# Patient Record
Sex: Male | Born: 1995 | Race: White | Hispanic: No | Marital: Single | Smoking: Current some day smoker
Health system: Southern US, Community
[De-identification: ages and names within clinical notes are randomized; demographics above are authoritative.]

## PROBLEM LIST (undated history)

## (undated) ENCOUNTER — Ambulatory Visit (HOSPITAL_COMMUNITY): Admission: EM | Payer: Medicaid Other | Source: Home / Self Care

## (undated) ENCOUNTER — Ambulatory Visit (HOSPITAL_COMMUNITY): Disposition: A | Discharge status: Home / Self Care | Payer: Self-pay

## (undated) DIAGNOSIS — Z653 Problems related to other legal circumstances: Secondary | ICD-10-CM

## (undated) DIAGNOSIS — R112 Nausea with vomiting, unspecified: Secondary | ICD-10-CM

## (undated) DIAGNOSIS — Z87898 Personal history of other specified conditions: Secondary | ICD-10-CM

## (undated) DIAGNOSIS — Z21 Asymptomatic human immunodeficiency virus [HIV] infection status: Secondary | ICD-10-CM

## (undated) DIAGNOSIS — S060X9A Concussion with loss of consciousness of unspecified duration, initial encounter: Secondary | ICD-10-CM

## (undated) DIAGNOSIS — B20 Human immunodeficiency virus [HIV] disease: Secondary | ICD-10-CM

## (undated) DIAGNOSIS — F17200 Nicotine dependence, unspecified, uncomplicated: Secondary | ICD-10-CM

## (undated) HISTORY — PX: HAND SURGERY: SHX662

## (undated) HISTORY — DX: Nausea with vomiting, unspecified: R11.2

## (undated) HISTORY — DX: Personal history of other specified conditions: Z87.898

## (undated) HISTORY — DX: Problems related to other legal circumstances: Z65.3

## (undated) HISTORY — DX: Concussion with loss of consciousness of unspecified duration, initial encounter: S06.0X9A

## (undated) HISTORY — DX: Nicotine dependence, unspecified, uncomplicated: F17.200

---
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
## 2014-02-16 ENCOUNTER — Emergency Department (INDEPENDENT_AMBULATORY_CARE_PROVIDER_SITE_OTHER)
Admission: EM | Admit: 2014-02-16 | Discharge: 2014-02-16 | Disposition: A | Discharge status: Home / Self Care | Payer: Medicaid Other | Source: Home / Self Care

## 2014-02-16 ENCOUNTER — Encounter (HOSPITAL_COMMUNITY): Payer: Self-pay | Admitting: Emergency Medicine

## 2014-02-16 DIAGNOSIS — S6990XA Unspecified injury of unspecified wrist, hand and finger(s), initial encounter: Secondary | ICD-10-CM

## 2014-02-16 DIAGNOSIS — M79644 Pain in right finger(s): Secondary | ICD-10-CM

## 2014-02-16 DIAGNOSIS — X58XXXA Exposure to other specified factors, initial encounter: Secondary | ICD-10-CM

## 2014-02-16 DIAGNOSIS — M79609 Pain in unspecified limb: Secondary | ICD-10-CM

## 2014-02-16 DIAGNOSIS — S6991XA Unspecified injury of right wrist, hand and finger(s), initial encounter: Secondary | ICD-10-CM

## 2014-02-16 DIAGNOSIS — S6980XA Other specified injuries of unspecified wrist, hand and finger(s), initial encounter: Secondary | ICD-10-CM

## 2014-02-16 MED ORDER — CEPHALEXIN 500 MG PO CAPS: 500.0000 mg | ORAL_CAPSULE | Freq: Four times a day (QID) | ORAL | Status: DC

## 2014-02-16 NOTE — ED Notes (Signed)
Was peeling metal holder, off a picture 3 days ago, it went under his nail and started bleeding.  Then he went fishing the same day with raw liver.  He noted swelling onset 2 days ago.  Has decreased ROM now due to pain.

## 2014-02-16 NOTE — ED Provider Notes (Signed)
CSN: [id number]     Arrival date & time 02/16/14  1843 History   First MD Initiated Contact with Patient 02/16/14 1942     Chief Complaint  Patient presents with  . Wound Infection   (Consider location/radiation/quality/duration/timing/severity/associated sxs/prior Treatment) HPI Comments: As above. Pain primarily to the thumb nail and pulp, lesser to the phalynx and IP joint.   History reviewed. No pertinent past medical history. Past Surgical History  Procedure Laterality Date  . Hand surgery     History reviewed. No pertinent family history. History  Substance Use Topics  . Smoking status: Current Some Day Smoker -- 0.50 packs/day    Types: Cigarettes  . Smokeless tobacco: Not on file  . Alcohol Use: No    Review of Systems  Constitutional: Negative.   Respiratory: Negative.   Genitourinary: Negative.   Musculoskeletal:       As per HPI  Skin: Negative for color change, pallor and rash.  Neurological: Negative for dizziness, weakness, numbness and headaches.    Allergies  Review of patient's allergies indicates no known allergies.  Home Medications   Prior to Admission medications   Medication Sig Start Date End Date Taking? Authorizing Provider  ibuprofen (ADVIL,MOTRIN) 600 MG tablet Take 1 tablet (600 mg total) by mouth every 6 (six) hours as needed. 11/24/13  Yes Loren Racer, MD  cephALEXin (KEFLEX) 500 MG capsule Take 1 capsule (500 mg total) by mouth 4 (four) times daily. 02/16/14   Hayden Rasmussen, NP  ibuprofen (ADVIL,MOTRIN) 200 MG tablet Take 600 mg by mouth every 6 (six) hours as needed (pain).    Historical Provider, MD  traMADol (ULTRAM) 50 MG tablet Take 1 tablet (50 mg total) by mouth every 6 (six) hours as needed. 11/24/13   Loren Racer, MD   BP 134/83  Pulse 60  Temp(Src) 99.3 F (37.4 C) (Oral)  Resp 14  SpO2 100% Physical Exam  Nursing note and vitals reviewed. Constitutional: He is oriented to person, place, and time. He appears  well-developed and well-nourished.  HENT:  Head: Normocephalic and atraumatic.  Eyes: EOM are normal.  Neck: Normal range of motion. Neck supple.  Musculoskeletal:  No redness. Minimal swelling to the distal thumb. No subungual hematoma or erythema. Nail plate intact, not loose.Tenderness to the pulp and over the nail but no signs of felon or other infection. Flexion and ext of the IP and MCP are intact to 80 deg of the IP. Mild tenderness to the IP joint.   Neurological: He is alert and oriented to person, place, and time. No cranial nerve deficit.  Skin: Skin is warm and dry.  Psychiatric: He has a normal mood and affect.    ED Course  Procedures (including critical care time) Labs Review Labs Reviewed - No data to display  Imaging Review No results found.   MDM   1. Pain of right thumb   2. Injury of nail bed of right thumb    I'm not certain this is truly infection but the mechanism of injury provides opportunity. No current signs of tenosynovitis at this time.  Keflex and warm soaks. F/U with hand doctor if worse.    Hayden Rasmussen, NP 02/16/14 2000

## 2014-02-16 NOTE — ED Provider Notes (Signed)
Medical screening examination/treatment/procedure(s) were performed by non-physician practitioner and as supervising physician I was immediately available for consultation/collaboration.  Timothy Trudell, M.D.  Arloa Prak C Laden Fieldhouse, MD 02/16/14 2225 

## 2014-03-17 ENCOUNTER — Emergency Department (INDEPENDENT_AMBULATORY_CARE_PROVIDER_SITE_OTHER)
Admission: EM | Admit: 2014-03-17 | Discharge: 2014-03-17 | Disposition: A | Discharge status: Home / Self Care | Payer: Medicaid Other | Source: Home / Self Care | Attending: Family Medicine | Admitting: Family Medicine

## 2014-03-17 ENCOUNTER — Encounter (HOSPITAL_COMMUNITY): Payer: Self-pay | Admitting: Emergency Medicine

## 2014-03-17 ENCOUNTER — Emergency Department (INDEPENDENT_AMBULATORY_CARE_PROVIDER_SITE_OTHER): Payer: Medicaid Other

## 2014-03-17 DIAGNOSIS — IMO0002 Reserved for concepts with insufficient information to code with codable children: Secondary | ICD-10-CM

## 2014-03-17 DIAGNOSIS — S60221A Contusion of right hand, initial encounter: Secondary | ICD-10-CM

## 2014-03-17 DIAGNOSIS — S60229A Contusion of unspecified hand, initial encounter: Secondary | ICD-10-CM

## 2014-03-17 NOTE — Discharge Instructions (Signed)
Ice and advil for soreness as needed,

## 2014-03-17 NOTE — ED Notes (Signed)
C/o right hand injury See physician note

## 2014-03-17 NOTE — ED Provider Notes (Signed)
CSN: [id number]     Arrival date & time 03/17/14  1856 History   First MD Initiated Contact with Patient 03/17/14 1935     Chief Complaint  Patient presents with  . Hand Injury   (Consider location/radiation/quality/duration/timing/severity/associated sxs/prior Treatment) Patient is a 18 y.o. male presenting with hand injury. The history is provided by the patient.  Hand Injury Location:  Hand Time since incident:  1 day Injury: yes   Mechanism of injury comment:  Punched a metal pole last eve from anger. Hand location:  Dorsum of R hand Pain details:    Quality:  Sharp   Radiates to:  Does not radiate   Severity:  Moderate   Onset quality:  Sudden Chronicity:  New Dislocation: no   Relieved by:  None tried Worsened by:  Nothing tried Associated symptoms: swelling   Associated symptoms: no numbness     History reviewed. No pertinent past medical history. Past Surgical History  Procedure Laterality Date  . Hand surgery     History reviewed. No pertinent family history. History  Substance Use Topics  . Smoking status: Current Some Day Smoker -- 0.50 packs/day    Types: Cigarettes  . Smokeless tobacco: Not on file  . Alcohol Use: No    Review of Systems  Constitutional: Negative.   Musculoskeletal: Positive for joint swelling.  Skin: Negative.     Allergies  Review of patient's allergies indicates no known allergies.  Home Medications   Prior to Admission medications   Medication Sig Start Date End Date Taking? Authorizing Provider  cephALEXin (KEFLEX) 500 MG capsule Take 1 capsule (500 mg total) by mouth 4 (four) times daily. 02/16/14   Hayden Rasmussenavid Mabe, NP  ibuprofen (ADVIL,MOTRIN) 200 MG tablet Take 600 mg by mouth every 6 (six) hours as needed (pain).    Historical Provider, MD  ibuprofen (ADVIL,MOTRIN) 600 MG tablet Take 1 tablet (600 mg total) by mouth every 6 (six) hours as needed. 11/24/13   Loren Raceravid Yelverton, MD  traMADol (ULTRAM) 50 MG tablet Take 1 tablet (50 mg  total) by mouth every 6 (six) hours as needed. 11/24/13   Loren Raceravid Yelverton, MD   BP 118/72  Pulse 75  Temp(Src) 98 F (36.7 C) (Oral)  Resp 12  SpO2 98% Physical Exam  Nursing note and vitals reviewed. Constitutional: He is oriented to person, place, and time. He appears well-developed and well-nourished. No distress.  Musculoskeletal: He exhibits tenderness.       Hands: Neurological: He is alert and oriented to person, place, and time.  Skin: Skin is warm and dry.    ED Course  Procedures (including critical care time) Labs Review Labs Reviewed - No data to display  Imaging Review Dg Hand Complete Right  03/17/2014   CLINICAL DATA:  Pain in the right hand.  EXAM: RIGHT HAND - COMPLETE 3+ VIEW  COMPARISON:  12/06/2011  FINDINGS: Three views of the right hand were obtained. Negative for a fracture or dislocation. Alignment of the right hand is normal. There is irregularity of the thumb tuft but this is likely chronic and could represent an old injury.  IMPRESSION: No acute bone abnormality.   Electronically Signed   By: Richarda OverlieAdam  Henn M.D.   On: 03/17/2014 20:22     MDM   1. Contusion of right hand, initial encounter    X-rays reviewed and report per radiologist.     Linna HoffJames D Malay Fantroy, MD 03/17/14 2035

## 2014-07-12 ENCOUNTER — Emergency Department (HOSPITAL_COMMUNITY)
Admission: EM | Admit: 2014-07-12 | Discharge: 2014-07-13 | Disposition: A | Discharge status: Home / Self Care | Payer: Medicaid Other | Attending: Emergency Medicine | Admitting: Emergency Medicine

## 2014-07-12 ENCOUNTER — Encounter (HOSPITAL_COMMUNITY): Payer: Self-pay | Admitting: Emergency Medicine

## 2014-07-12 ENCOUNTER — Emergency Department (HOSPITAL_COMMUNITY): Payer: Medicaid Other

## 2014-07-12 ENCOUNTER — Emergency Department (INDEPENDENT_AMBULATORY_CARE_PROVIDER_SITE_OTHER)
Admission: EM | Admit: 2014-07-12 | Discharge: 2014-07-12 | Disposition: A | Discharge status: Nursing Home (Includes ICF) | Payer: Medicaid Other | Source: Home / Self Care | Attending: Family Medicine | Admitting: Family Medicine

## 2014-07-12 DIAGNOSIS — R1084 Generalized abdominal pain: Secondary | ICD-10-CM | POA: Diagnosis not present

## 2014-07-12 DIAGNOSIS — Z792 Long term (current) use of antibiotics: Secondary | ICD-10-CM | POA: Diagnosis not present

## 2014-07-12 DIAGNOSIS — K529 Noninfective gastroenteritis and colitis, unspecified: Secondary | ICD-10-CM

## 2014-07-12 DIAGNOSIS — R112 Nausea with vomiting, unspecified: Secondary | ICD-10-CM | POA: Insufficient documentation

## 2014-07-12 DIAGNOSIS — Z720 Tobacco use: Secondary | ICD-10-CM | POA: Insufficient documentation

## 2014-07-12 DIAGNOSIS — R197 Diarrhea, unspecified: Secondary | ICD-10-CM

## 2014-07-12 DIAGNOSIS — R109 Unspecified abdominal pain: Secondary | ICD-10-CM

## 2014-07-12 DIAGNOSIS — E860 Dehydration: Secondary | ICD-10-CM

## 2014-07-12 LAB — CBC WITH DIFFERENTIAL/PLATELET
Basophils Absolute: 0 10*3/uL (ref 0.0–0.1)
Basophils Relative: 0 % (ref 0–1)
EOS ABS: 0 10*3/uL (ref 0.0–0.7)
Eosinophils Relative: 0 % (ref 0–5)
HCT: 41.8 % (ref 39.0–52.0)
HEMOGLOBIN: 14 g/dL (ref 13.0–17.0)
LYMPHS ABS: 2.2 10*3/uL (ref 0.7–4.0)
Lymphocytes Relative: 38 % (ref 12–46)
MCH: 28.2 pg (ref 26.0–34.0)
MCHC: 33.5 g/dL (ref 30.0–36.0)
MCV: 84.1 fL (ref 78.0–100.0)
MONOS PCT: 10 % (ref 3–12)
Monocytes Absolute: 0.6 10*3/uL (ref 0.1–1.0)
NEUTROS ABS: 2.9 10*3/uL (ref 1.7–7.7)
Neutrophils Relative %: 52 % (ref 43–77)
Platelets: 188 10*3/uL (ref 150–400)
RBC: 4.97 MIL/uL (ref 4.22–5.81)
RDW: 12.7 % (ref 11.5–15.5)
WBC: 5.7 10*3/uL (ref 4.0–10.5)

## 2014-07-12 LAB — COMPREHENSIVE METABOLIC PANEL
ALT: 15 U/L (ref 0–53)
AST: 27 U/L (ref 0–37)
Albumin: 4.1 g/dL (ref 3.5–5.2)
Alkaline Phosphatase: 101 U/L (ref 39–117)
Anion gap: 13 (ref 5–15)
BILIRUBIN TOTAL: 0.5 mg/dL (ref 0.3–1.2)
BUN: 11 mg/dL (ref 6–23)
CHLORIDE: 99 meq/L (ref 96–112)
CO2: 28 mEq/L (ref 19–32)
Calcium: 9.3 mg/dL (ref 8.4–10.5)
Creatinine, Ser: 0.77 mg/dL (ref 0.50–1.35)
GFR calc non Af Amer: >90 mL/min (ref >90)
GLUCOSE: 95 mg/dL (ref 70–99)
POTASSIUM: 4.4 meq/L (ref 3.7–5.3)
Sodium: 140 mEq/L (ref 137–147)
Total Protein: 8.1 g/dL (ref 6.0–8.3)

## 2014-07-12 MED ORDER — DICYCLOMINE HCL 10 MG PO CAPS
10.0000 mg | ORAL_CAPSULE | Freq: Once | ORAL | Status: AC
  Administered 2014-07-13: 10 mg via ORAL
  Filled 2014-07-12: qty 1

## 2014-07-12 MED ORDER — ONDANSETRON 4 MG PO TBDP
ORAL_TABLET | ORAL | Status: DC
Start: 2014-07-12 — End: 2014-07-13
  Filled 2014-07-12: qty 2

## 2014-07-12 MED ORDER — ONDANSETRON 4 MG PO TBDP
8.0000 mg | ORAL_TABLET | Freq: Once | ORAL | Status: AC
  Administered 2014-07-12: 8 mg via ORAL

## 2014-07-12 MED ORDER — ONDANSETRON HCL 4 MG/2ML IJ SOLN
4.0000 mg | Freq: Once | INTRAMUSCULAR | Status: AC
Start: 2014-07-12 — End: 2014-07-12
  Administered 2014-07-12: 4 mg via INTRAVENOUS
  Filled 2014-07-12: qty 2

## 2014-07-12 MED ORDER — SODIUM CHLORIDE 0.9 % IV BOLUS (SEPSIS)
1000.0000 mL | Freq: Once | INTRAVENOUS | Status: AC
  Administered 2014-07-12: 1000 mL via INTRAVENOUS

## 2014-07-12 NOTE — ED Notes (Signed)
Called for triage with no answer

## 2014-07-12 NOTE — ED Provider Notes (Signed)
CSN: [id number]     Arrival date & time 07/12/14  1813 History   First MD Initiated Contact with Patient 07/12/14 1836     Chief Complaint  Patient presents with  . Emesis   (Consider location/radiation/quality/duration/timing/severity/associated sxs/prior Treatment) Patient is a 18 y.o. male presenting with vomiting. The history is provided by the patient and a friend.  Emesis Severity:  Moderate Duration:  6 days Quality:  Bright red blood and stomach contents Progression:  Partially resolved Chronicity:  New Relieved by:  Nothing Worsened by:  Nothing tried Ineffective treatments:  None tried Associated symptoms: abdominal pain and diarrhea   Associated symptoms: no chills, no fever and no sore throat   Risk factors: sick contacts   Risk factors: no alcohol use     No past medical history on file. Past Surgical History  Procedure Laterality Date  . Hand surgery     No family history on file. History  Substance Use Topics  . Smoking status: Current Some Day Smoker -- 0.50 packs/day    Types: Cigarettes  . Smokeless tobacco: Not on file  . Alcohol Use: No    Review of Systems  Constitutional: Negative for fever, chills and appetite change.  HENT: Negative for sore throat.   Respiratory: Negative for cough.   Gastrointestinal: Positive for nausea, vomiting, abdominal pain and diarrhea. Negative for constipation and blood in stool.  Genitourinary: Negative.   Skin: Negative.     Allergies  Review of patient's allergies indicates no known allergies.  Home Medications   Prior to Admission medications   Medication Sig Start Date End Date Taking? Authorizing Provider  cephALEXin (KEFLEX) 500 MG capsule Take 1 capsule (500 mg total) by mouth 4 (four) times daily. 02/16/14   Hayden Rasmussenavid Mabe, NP  ibuprofen (ADVIL,MOTRIN) 200 MG tablet Take 600 mg by mouth every 6 (six) hours as needed (pain).    Historical Provider, MD  ibuprofen (ADVIL,MOTRIN) 600 MG tablet Take 1 tablet  (600 mg total) by mouth every 6 (six) hours as needed. 11/24/13   Loren Raceravid Yelverton, MD  traMADol (ULTRAM) 50 MG tablet Take 1 tablet (50 mg total) by mouth every 6 (six) hours as needed. 11/24/13   Loren Raceravid Yelverton, MD   BP 136/83 mmHg  Pulse 84  Temp(Src) 98.1 F (36.7 C) (Oral)  Resp 14  SpO2 98% Physical Exam  Constitutional: He is oriented to person, place, and time. He appears well-developed. He appears distressed.  HENT:  Mouth/Throat: Uvula is midline. Mucous membranes are dry. No oropharyngeal exudate or posterior oropharyngeal erythema.  Cardiovascular: Normal heart sounds.   Pulmonary/Chest: Breath sounds normal.  Abdominal: He exhibits no distension and no mass. Bowel sounds are decreased. There is no hepatosplenomegaly. There is tenderness in the right upper quadrant and right lower quadrant. There is no rigidity, no rebound, no guarding, no CVA tenderness, no tenderness at McBurney's point and negative Murphy's sign.  Neurological: He is alert and oriented to person, place, and time.  Skin: Skin is warm and dry.  Nursing note and vitals reviewed.   ED Course  Procedures (including critical care time) Labs Review Labs Reviewed - No data to display  Imaging Review No results found.   MDM   1. Gastroenteritis, acute   2. Mild dehydration        Linna HoffJames D Kamuela Magos, MD 07/12/14 1842

## 2014-07-12 NOTE — ED Notes (Signed)
Patient transported to X-ray 

## 2014-07-12 NOTE — ED Notes (Addendum)
Pt. reports emesis and diarrhea for 6 days , noticed blood at vomitus this morning , denies fever or chills, mild generalized abdominal pain . Seen at Urgent Care this evening diagnosed with gastroenteritis and dehydration .

## 2014-07-12 NOTE — ED Notes (Signed)
Patient here for vomiting Patient's girlfriend states he has been vomiting on and off for the past six days Stated last night when he vomited there was some blood mixed in with it Patient has been having some diarrhea as welll Complaining of ABD pain that is centralized

## 2014-07-13 MED ORDER — ONDANSETRON 8 MG PO TBDP: 8.0000 mg | ORAL_TABLET | Freq: Three times a day (TID) | ORAL | Status: DC | PRN

## 2014-07-13 MED ORDER — SODIUM CHLORIDE 0.9 % IV BOLUS (SEPSIS)
1000.0000 mL | Freq: Once | INTRAVENOUS | Status: AC
  Administered 2014-07-13: 1000 mL via INTRAVENOUS

## 2014-07-13 MED ORDER — DICYCLOMINE HCL 20 MG PO TABS: 20.0000 mg | ORAL_TABLET | Freq: Two times a day (BID) | ORAL | Status: DC

## 2014-07-13 NOTE — ED Notes (Signed)
Discussed need for stool sample, patient reports he has not had stool for 2 days now.

## 2014-07-13 NOTE — ED Notes (Signed)
Pt ambulating independently w/ steady gait on d/c in no acute distress, A&Ox4. D/c instructions reviewed w/ pt and family - pt and family deny any further questions or concerns at present. Rx given x2  

## 2014-07-13 NOTE — Discharge Instructions (Signed)
Bentyl for abdominal cramping. zofran for nausea. Follow up with your primary care doctor if symptoms continue.   Viral Gastroenteritis Viral gastroenteritis is also known as stomach flu. This condition affects the stomach and intestinal tract. It can cause sudden diarrhea and vomiting. The illness typically lasts 3 to 8 days. Most people develop an immune response that eventually gets rid of the virus. While this natural response develops, the virus can make you quite ill. CAUSES  Many different viruses can cause gastroenteritis, such as rotavirus or noroviruses. You can catch one of these viruses by consuming contaminated food or water. You may also catch a virus by sharing utensils or other personal items with an infected person or by touching a contaminated surface. SYMPTOMS  The most common symptoms are diarrhea and vomiting. These problems can cause a severe loss of body fluids (dehydration) and a body salt (electrolyte) imbalance. Other symptoms may include:  Fever.  Headache.  Fatigue.  Abdominal pain. DIAGNOSIS  Your caregiver can usually diagnose viral gastroenteritis based on your symptoms and a physical exam. A stool sample may also be taken to test for the presence of viruses or other infections. TREATMENT  This illness typically goes away on its own. Treatments are aimed at rehydration. The most serious cases of viral gastroenteritis involve vomiting so severely that you are not able to keep fluids down. In these cases, fluids must be given through an intravenous line (IV). HOME CARE INSTRUCTIONS   Drink enough fluids to keep your urine clear or pale yellow. Drink small amounts of fluids frequently and increase the amounts as tolerated.  Ask your caregiver for specific rehydration instructions.  Avoid:  Foods high in sugar.  Alcohol.  Carbonated drinks.  Tobacco.  Juice.  Caffeine drinks.  Extremely hot or cold fluids.  Fatty, greasy foods.  Too much intake  of anything at one time.  Dairy products until 24 to 48 hours after diarrhea stops.  You may consume probiotics. Probiotics are active cultures of beneficial bacteria. They may lessen the amount and number of diarrheal stools in adults. Probiotics can be found in yogurt with active cultures and in supplements.  Wash your hands well to avoid spreading the virus.  Only take over-the-counter or prescription medicines for pain, discomfort, or fever as directed by your caregiver. Do not give aspirin to children. Antidiarrheal medicines are not recommended.  Ask your caregiver if you should continue to take your regular prescribed and over-the-counter medicines.  Keep all follow-up appointments as directed by your caregiver. SEEK IMMEDIATE MEDICAL CARE IF:   You are unable to keep fluids down.  You do not urinate at least once every 6 to 8 hours.  You develop shortness of breath.  You notice blood in your stool or vomit. This may look like coffee grounds.  You have abdominal pain that increases or is concentrated in one small area (localized).  You have persistent vomiting or diarrhea.  You have a fever.  The patient is a child younger than 3 months, and he or she has a fever.  The patient is a child older than 3 months, and he or she has a fever and persistent symptoms.  The patient is a child older than 3 months, and he or she has a fever and symptoms suddenly get worse.  The patient is a baby, and he or she has no tears when crying. MAKE SURE YOU:   Understand these instructions.  Will watch your condition.  Will get help right  away if you are not doing well or get worse. Document Released: 08/27/2005 Document Revised: 11/19/2011 Document Reviewed: 06/13/2011 [facility] Patient Information 2015 [place]. This information is not intended to replace advice given to you by your health care provider. Make sure you discuss any questions you have with your health care  provider.

## 2014-07-13 NOTE — ED Provider Notes (Signed)
CSN: [id number]     Arrival date & time 07/12/14  1847 History   First MD Initiated Contact with Patient 07/12/14 2300     Chief Complaint  Patient presents with  . Emesis  . Diarrhea     (Consider location/radiation/quality/duration/timing/severity/associated sxs/prior Treatment) HPI Jerry Finley is a 18 y.o. male Who presents to emergency department complaining of nausea, vomiting, abdominal pain, diarrhea. Patient states his symptoms began 6 days ago. Prior to today, patient had clear emesis, or food contents. States he would usually vomit approximately 1-2 hours after eating. He states his pain is diffuse all over his abdomen. Reports diarrhea, but states he only has about 1 bowel movement a day. States it is watery. Denies any blood in his stool. States today he noticed some bright red blood in his emesis. He went to urgent care where he was diagnosed with dehydration and gastroenteritis and transferred to here. Patient denies any recent travel out of the country. He denies any contact with  Anyone with similar symptoms, no nursing home contacts. Patient states he is otherwise healthy. No medications taken for this. Denies any fever or chills.3 No history of similar problems in the past, no abdominal surgeries.  History reviewed. No pertinent past medical history. Past Surgical History  Procedure Laterality Date  . Hand surgery     No family history on file. History  Substance Use Topics  . Smoking status: Current Some Day Smoker -- 0.50 packs/day    Types: Cigarettes  . Smokeless tobacco: Not on file  . Alcohol Use: No    Review of Systems  Constitutional: Negative for fever and chills.  Respiratory: Negative for cough, chest tightness and shortness of breath.   Cardiovascular: Negative for chest pain, palpitations and leg swelling.  Gastrointestinal: Positive for nausea, vomiting and diarrhea. Negative for abdominal pain and abdominal distention.  Genitourinary:  Negative for dysuria, urgency, frequency and hematuria.  Musculoskeletal: Negative for myalgias, arthralgias, neck pain and neck stiffness.  Skin: Negative for rash.  Allergic/Immunologic: Negative for immunocompromised state.  Neurological: Negative for dizziness, weakness, light-headedness, numbness and headaches.      Allergies  Review of patient's allergies indicates no known allergies.  Home Medications   Prior to Admission medications   Medication Sig Start Date End Date Taking? Authorizing Provider  cephALEXin (KEFLEX) 500 MG capsule Take 1 capsule (500 mg total) by mouth 4 (four) times daily. 02/16/14   Hayden Rasmussenavid Mabe, NP  ibuprofen (ADVIL,MOTRIN) 200 MG tablet Take 600 mg by mouth every 6 (six) hours as needed (pain).    Historical Provider, MD  ibuprofen (ADVIL,MOTRIN) 600 MG tablet Take 1 tablet (600 mg total) by mouth every 6 (six) hours as needed. 11/24/13   Loren Raceravid Yelverton, MD  traMADol (ULTRAM) 50 MG tablet Take 1 tablet (50 mg total) by mouth every 6 (six) hours as needed. 11/24/13   Loren Raceravid Yelverton, MD   BP 106/63 mmHg  Pulse 62  Temp(Src) 97.6 F (36.4 C) (Oral)  Resp 20  Ht 5\' 7"  (1.702 m)  Wt 130 lb (58.968 kg)  BMI 20.36 kg/m2  SpO2 100% Physical Exam  Constitutional: He appears well-developed and well-nourished. No distress.  HENT:  Head: Normocephalic and atraumatic.  Eyes: Conjunctivae are normal.  Neck: Neck supple.  Cardiovascular: Normal rate, regular rhythm and normal heart sounds.   Pulmonary/Chest: Effort normal. No respiratory distress. He has no wheezes. He has no rales.  Abdominal: Soft. Bowel sounds are normal. He exhibits no distension. There is tenderness.  There is no rebound and no guarding.  Diffuse tenderness  Musculoskeletal: He exhibits no edema.  Neurological: He is alert.  Skin: Skin is warm and dry.  Nursing note and vitals reviewed.   ED Course  Procedures (including critical care time) Labs Review Labs Reviewed  CLOSTRIDIUM  DIFFICILE BY PCR  STOOL CULTURE  CBC WITH DIFFERENTIAL  COMPREHENSIVE METABOLIC PANEL    Imaging Review Dg Abd 2 Views  07/13/2014   CLINICAL DATA:  Initial evaluation for left upper quadrant abdominal pain, nausea, vomiting, for past 3 days.  EXAM: ABDOMEN - 2 VIEW  COMPARISON:  None.  FINDINGS: Bowel gas pattern within normal limits without evidence of obstruction or ileus. No abnormal bowel wall thickening. No free air. No soft tissue mass or abnormal calcification.  Visualized lung bases are clear.  No acute osseous abnormality.  IMPRESSION: Nonobstructive bowel gas pattern with no radiographic evidence of acute intra-abdominal abnormality.   Electronically Signed   By: Jerry MuBenjamin  McClintock M.D.   On: 07/13/2014 00:35     EKG Interpretation None      MDM   Final diagnoses:  Abdominal pain  Diarrhea    Pt with nausea, vomiting, diarrhea for 6 days. Today small amount of bright red blood in emesis. Most likely mallory weiss tear.  H&H is normal. No vomiting in ED. Pt has been here for over 6 hrs with no vomiting. Also asked for stool sample, no BM in ED, unable to provide. Abdomen tender diffusely but benign. abd xray negative. VS normal. Rehydrated with 2L of NS. Zofran and bentyl for symptoms. Pt feels much better. Stable for d/c home with outpatient follow up. Home with zofran and bentyl.   Filed Vitals:   07/12/14 2330 07/13/14 0005 07/13/14 0015 07/13/14 0030  BP: 122/66 106/63 112/60 109/63  Pulse:  62 61 66  Temp:      TempSrc:      Resp: 25 20    Height:      Weight:      SpO2:  100% 100% 100%      Aries Kasa A Allyson Tineo, PA-C 07/13/14 0101

## 2015-09-01 ENCOUNTER — Ambulatory Visit: Payer: Medicaid Other

## 2016-02-21 ENCOUNTER — Telehealth: Payer: Self-pay

## 2016-02-21 NOTE — Telephone Encounter (Signed)
[facility] and needs appointment to transition into care.  Appointment scheduled as labs and office visit only.   Lab orders entered.  Information given to Daisey MustWayne Stewart, RN  [place] [facility]   Phone number:  [phone]   Laurell Josephsammy K Kawthar Ennen, RN

## 2016-03-19 ENCOUNTER — Other Ambulatory Visit (INDEPENDENT_AMBULATORY_CARE_PROVIDER_SITE_OTHER): Payer: Self-pay

## 2016-03-19 ENCOUNTER — Ambulatory Visit: Payer: Self-pay

## 2016-03-19 DIAGNOSIS — B20 Human immunodeficiency virus [HIV] disease: Secondary | ICD-10-CM

## 2016-03-19 DIAGNOSIS — Z113 Encounter for screening for infections with a predominantly sexual mode of transmission: Secondary | ICD-10-CM

## 2016-03-19 DIAGNOSIS — Z79899 Other long term (current) drug therapy: Secondary | ICD-10-CM

## 2016-03-19 LAB — CBC WITH DIFFERENTIAL/PLATELET
Basophils Absolute: 46 cells/uL (ref 0–200)
Basophils Relative: 1 %
Eosinophils Absolute: 138 cells/uL (ref 15–500)
Eosinophils Relative: 3 %
HEMATOCRIT: 41.8 % (ref 38.5–50.0)
Hemoglobin: 14.1 g/dL (ref 13.2–17.1)
LYMPHS PCT: 33 %
Lymphs Abs: 1518 cells/uL (ref 850–3900)
MCH: 29.9 pg (ref 27.0–33.0)
MCHC: 33.7 g/dL (ref 32.0–36.0)
MCV: 88.6 fL (ref 80.0–100.0)
MPV: 8.8 fL (ref 7.5–12.5)
Monocytes Absolute: 460 cells/uL (ref 200–950)
Monocytes Relative: 10 %
Neutro Abs: 2438 cells/uL (ref 1500–7800)
Neutrophils Relative %: 53 %
PLATELETS: 215 10*3/uL (ref 140–400)
RBC: 4.72 MIL/uL (ref 4.20–5.80)
RDW: 13.8 % (ref 11.0–15.0)
WBC: 4.6 10*3/uL (ref 3.8–10.8)

## 2016-03-19 LAB — LIPID PANEL
CHOL/HDL RATIO: 4.4 ratio (ref <=5.0)
Cholesterol: 135 mg/dL (ref 125–200)
HDL: 31 mg/dL — AB (ref >=40)
LDL Cholesterol: 69 mg/dL (ref <130)
Triglycerides: 175 mg/dL — ABNORMAL HIGH (ref <150)
VLDL: 35 mg/dL — ABNORMAL HIGH (ref <30)

## 2016-03-19 LAB — COMPLETE METABOLIC PANEL WITH GFR
ALT: 10 U/L (ref 9–46)
AST: 18 U/L (ref 10–40)
Albumin: 4.1 g/dL (ref 3.6–5.1)
Alkaline Phosphatase: 117 U/L — ABNORMAL HIGH (ref 40–115)
BILIRUBIN TOTAL: 0.4 mg/dL (ref 0.2–1.2)
BUN: 11 mg/dL (ref 7–25)
CO2: 26 mmol/L (ref 20–31)
CREATININE: 0.83 mg/dL (ref 0.60–1.35)
Calcium: 8.9 mg/dL (ref 8.6–10.3)
Chloride: 106 mmol/L (ref 98–110)
GFR, Est African American: >89 mL/min (ref >=60)
GFR, Est Non African American: >89 mL/min (ref >=60)
Glucose, Bld: 68 mg/dL (ref 65–99)
Potassium: 3.8 mmol/L (ref 3.5–5.3)
Sodium: 140 mmol/L (ref 135–146)
TOTAL PROTEIN: 6.8 g/dL (ref 6.1–8.1)

## 2016-03-19 LAB — HEPATITIS B CORE ANTIBODY, TOTAL: Hep B Core Total Ab: NONREACTIVE

## 2016-03-19 LAB — HEPATITIS B SURFACE ANTIBODY,QUALITATIVE: Hep B S Ab: POSITIVE — AB

## 2016-03-19 LAB — HEPATITIS B SURFACE ANTIGEN: Hepatitis B Surface Ag: NEGATIVE

## 2016-03-19 LAB — HEPATITIS A ANTIBODY, TOTAL: HEP A TOTAL AB: REACTIVE — AB

## 2016-03-19 LAB — HEPATITIS C ANTIBODY: HCV AB: NEGATIVE

## 2016-03-20 DIAGNOSIS — B20 Human immunodeficiency virus [HIV] disease: Secondary | ICD-10-CM | POA: Insufficient documentation

## 2016-03-20 LAB — URINALYSIS
Bilirubin Urine: NEGATIVE
Glucose, UA: NEGATIVE
HGB URINE DIPSTICK: NEGATIVE
KETONES UR: NEGATIVE
Leukocytes, UA: NEGATIVE
NITRITE: NEGATIVE
PH: 5.5 (ref 5.0–8.0)
Protein, ur: NEGATIVE
Specific Gravity, Urine: 1.017 (ref 1.001–1.035)

## 2016-03-20 LAB — T-HELPER CELL (CD4) - (RCID CLINIC ONLY)
CD4 % Helper T Cell: 37 % (ref 33–55)
CD4 T CELL ABS: 570 /uL (ref 400–2700)

## 2016-03-20 LAB — RPR

## 2016-03-20 LAB — HIV-1 RNA ULTRAQUANT REFLEX TO GENTYP+
HIV 1 RNA Quant: <20 copies/mL (ref <20)
HIV-1 RNA Quant, Log: <1.3 Log copies/mL (ref <1.30)

## 2016-03-20 LAB — URINE CYTOLOGY ANCILLARY ONLY
Chlamydia: NEGATIVE
NEISSERIA GONORRHEA: NEGATIVE

## 2016-03-21 LAB — QUANTIFERON TB GOLD ASSAY (BLOOD)
Interferon Gamma Release Assay: NEGATIVE
Mitogen-Nil: >10 IU/mL
Quantiferon Nil Value: 0.04 IU/mL
Quantiferon Tb Ag Minus Nil Value: 0 IU/mL

## 2016-03-23 LAB — HLA B*5701: HLA-B 5701 W/RFLX HLA-B HIGH: NEGATIVE

## 2016-04-04 ENCOUNTER — Encounter: Payer: Self-pay | Admitting: Infectious Disease

## 2016-04-09 ENCOUNTER — Encounter: Payer: Self-pay | Admitting: Infectious Disease

## 2016-04-09 ENCOUNTER — Ambulatory Visit (INDEPENDENT_AMBULATORY_CARE_PROVIDER_SITE_OTHER): Payer: Self-pay | Admitting: Infectious Disease

## 2016-04-09 ENCOUNTER — Ambulatory Visit: Payer: Medicaid Other | Admitting: *Deleted

## 2016-04-09 DIAGNOSIS — Z653 Problems related to other legal circumstances: Secondary | ICD-10-CM

## 2016-04-09 DIAGNOSIS — F4323 Adjustment disorder with mixed anxiety and depressed mood: Secondary | ICD-10-CM

## 2016-04-09 DIAGNOSIS — Z87898 Personal history of other specified conditions: Secondary | ICD-10-CM

## 2016-04-09 DIAGNOSIS — F1990 Other psychoactive substance use, unspecified, uncomplicated: Secondary | ICD-10-CM

## 2016-04-09 DIAGNOSIS — F1991 Other psychoactive substance use, unspecified, in remission: Secondary | ICD-10-CM

## 2016-04-09 DIAGNOSIS — B20 Human immunodeficiency virus [HIV] disease: Secondary | ICD-10-CM

## 2016-04-09 HISTORY — DX: Other psychoactive substance use, unspecified, in remission: F19.91

## 2016-04-09 HISTORY — DX: Personal history of other specified conditions: Z87.898

## 2016-04-09 MED ORDER — ELVITEG-COBIC-EMTRICIT-TENOFAF 150-150-200-10 MG PO TABS: 1.0000 | ORAL_TABLET | Freq: Every day | ORAL | 11 refills | Status: DC

## 2016-04-09 NOTE — BH Specialist Note (Signed)
Counselor met with Jerry Finley in the exam room for a warm hand off as patient is new.  Patient stated that he was very tired and shared that he had a new born son that was only two days old.  Patient indicated that he was in a whirlwind right now because of the baby being born and being a new father.  Patient stated that he was a bit overwhelmed by all that was going on in his life but was not sad or having any emotional problems at this time. Counselor shared with patient about counseling services offered and encouraged patient to take advantage of the services if and when he needed it.  Patient agreed he would.   Rolena Infante ,MA, LPC [facility]

## 2016-04-09 NOTE — Progress Notes (Signed)
Chief complaint establish care for HIV disease.  Subjective:    Patient ID: Jerry Finley, male    DOB: 02/03/1996, 20 y.o.   MRN: [id number]  HPI  This is a 20 year old Caucasian man who has been diagnosed with HIV within the last year. He spent much of this time in jail for having violated probation for a break and related to his substance abuse problems. He has a history of prior intravenous heroin use and he believes that is how he contracted HIV he does not have hepatitis C by labs.  He has been maintained on GENVOYA with nice virological control.  He states that his heroin use is in remission and any will not use again. He is not in a specific program such as NA though I have encouraged him to engage in such a program and to meet with our counselor Lennox Laity.  He has just had a child 2 days ago and his wife has been negative for HIV infection.  Past Medical History:  Diagnosis Date  . 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 Legal problem 04/09/2016    Past Surgical History:  Procedure Laterality Date  . HAND SURGERY      No family history on file.    Social History   Social History  . Marital status: Single    Spouse name: N/A  . Number of children: N/A  . Years of education: N/A   Social History Main Topics  . Smoking status: Current Every Day Smoker    Packs/day: 0.50    Types: Cigarettes  . Smokeless tobacco: Never Used  . Alcohol use No  . Drug use: No  . Sexual activity: Yes    Partners: Female   Other Topics Concern  . None   Social History Narrative  . None    No Known Allergies   Current Outpatient Prescriptions:  .  elvitegravir-cobicistat-emtricitabine-tenofovir (GENVOYA) 150-150-200-10 MG TABS tablet, Take 1 tablet by mouth daily with breakfast., Disp: 30 tablet, Rfl: 11    Review of Systems  Constitutional: Negative for activity change, appetite change, chills, diaphoresis, fatigue, fever and unexpected weight change.  HENT:  Negative for congestion, rhinorrhea, sinus pressure, sneezing, sore throat and trouble swallowing.   Eyes: Negative for photophobia and visual disturbance.  Respiratory: Negative for cough, chest tightness, shortness of breath, wheezing and stridor.   Cardiovascular: Negative for chest pain, palpitations and leg swelling.  Gastrointestinal: Negative for abdominal distention, abdominal pain, anal bleeding, blood in stool, constipation, diarrhea, nausea and vomiting.  Genitourinary: Negative for difficulty urinating, dysuria, flank pain and hematuria.  Musculoskeletal: Negative for arthralgias, back pain, gait problem, joint swelling and myalgias.  Skin: Negative for color change, pallor, rash and wound.  Neurological: Negative for dizziness, tremors, weakness and light-headedness.  Hematological: Negative for adenopathy. Does not bruise/bleed easily.  Psychiatric/Behavioral: Negative for agitation, behavioral problems, confusion, decreased concentration, dysphoric mood and sleep disturbance.       Objective:   Physical Exam  Constitutional: He is oriented to person, place, and time. He appears well-developed and well-nourished.  HENT:  Head: Normocephalic and atraumatic.  Eyes: Conjunctivae and EOM are normal.  Neck: Normal range of motion. Neck supple.  Cardiovascular: Normal rate and regular rhythm.   Pulmonary/Chest: Effort normal. No respiratory distress. He has no wheezes.  Abdominal: Soft. He exhibits no distension.  Musculoskeletal: Normal range of motion. He exhibits no edema or tenderness.  Neurological: He is alert and oriented to person, place, and time.  Skin: Skin is warm and dry. No rash noted. No erythema. No pallor.  Psychiatric: He has a normal mood and affect. His behavior is normal. Judgment and thought content normal.          Assessment & Plan:   #1 HIV disease: Continue GENVOYA and return to clinic in a few months time for repeat labs.  #2 IV drug use  history in remission: Counseled him on the risk of recurrence including transmission of other infectious diseases such as hepatitis C and also risk for infectious endocarditis which cannot be obviated by not sharing needles  I spent greater than 60 minutes with the patient including greater than 50% of time in face to face counsel of the patient re his HIV his hx of IVDU  and in coordination of his care.

## 2016-04-11 ENCOUNTER — Encounter: Payer: Self-pay | Admitting: Licensed Clinical Social Worker

## 2016-04-23 ENCOUNTER — Telehealth: Payer: Self-pay | Admitting: *Deleted

## 2016-04-23 NOTE — Telephone Encounter (Signed)
Pt needing HIV rxes.  [facility]  [facility]

## 2016-05-01 NOTE — Telephone Encounter (Signed)
[facility] the [facility]

## 2016-05-06 NOTE — Telephone Encounter (Signed)
Has he received them now?

## 2016-05-16 NOTE — Telephone Encounter (Signed)
Picked up rx 04/25/16

## 2016-05-16 NOTE — Telephone Encounter (Signed)
Ok very good.

## 2016-06-04 ENCOUNTER — Encounter (HOSPITAL_COMMUNITY): Payer: Self-pay | Admitting: Emergency Medicine

## 2016-06-04 ENCOUNTER — Emergency Department (HOSPITAL_COMMUNITY)
Admission: EM | Admit: 2016-06-04 | Discharge: 2016-06-05 | Disposition: A | Discharge status: Home / Self Care | Payer: Self-pay | Attending: Emergency Medicine | Admitting: Emergency Medicine

## 2016-06-04 ENCOUNTER — Emergency Department (HOSPITAL_COMMUNITY): Payer: Self-pay

## 2016-06-04 DIAGNOSIS — Y9289 Other specified places as the place of occurrence of the external cause: Secondary | ICD-10-CM | POA: Insufficient documentation

## 2016-06-04 DIAGNOSIS — Y990 Civilian activity done for income or pay: Secondary | ICD-10-CM | POA: Insufficient documentation

## 2016-06-04 DIAGNOSIS — W11XXXA Fall on and from ladder, initial encounter: Secondary | ICD-10-CM | POA: Insufficient documentation

## 2016-06-04 DIAGNOSIS — F17210 Nicotine dependence, cigarettes, uncomplicated: Secondary | ICD-10-CM | POA: Insufficient documentation

## 2016-06-04 DIAGNOSIS — S0990XA Unspecified injury of head, initial encounter: Secondary | ICD-10-CM

## 2016-06-04 DIAGNOSIS — Y9339 Activity, other involving climbing, rappelling and jumping off: Secondary | ICD-10-CM | POA: Insufficient documentation

## 2016-06-04 DIAGNOSIS — S80212A Abrasion, left knee, initial encounter: Secondary | ICD-10-CM | POA: Insufficient documentation

## 2016-06-04 DIAGNOSIS — S060X1A Concussion with loss of consciousness of 30 minutes or less, initial encounter: Secondary | ICD-10-CM | POA: Insufficient documentation

## 2016-06-04 DIAGNOSIS — S0181XA Laceration without foreign body of other part of head, initial encounter: Secondary | ICD-10-CM | POA: Insufficient documentation

## 2016-06-04 HISTORY — DX: Human immunodeficiency virus (HIV) disease: B20

## 2016-06-04 HISTORY — DX: Asymptomatic human immunodeficiency virus (hiv) infection status: Z21

## 2016-06-04 MED ORDER — IBUPROFEN 800 MG PO TABS
800.0000 mg | ORAL_TABLET | Freq: Once | ORAL | Status: AC
  Administered 2016-06-05: 800 mg via ORAL
  Filled 2016-06-04: qty 1

## 2016-06-04 NOTE — ED Triage Notes (Signed)
Called X2 for triage no response.

## 2016-06-04 NOTE — ED Triage Notes (Signed)
Pt fell off ladder approx 7 feet. Ladder fell partially on pt. Pt denies LOC. Pt has small lac to left chin. Pt complaining of headache and feels dizzy/tired. Pt alert and oriented, neuro intact

## 2016-06-04 NOTE — ED Provider Notes (Signed)
[facility]-EMERGENCY DEPT Provider Note   CSN: [id number] Arrival date & time: 06/04/16  1534     History   Chief Complaint Chief Complaint  Patient presents with  . Fall    HPI Jerry Finley is a 20 y.o. male.  HPI   20 year old male with history of HIV, remote IV drug use presenting for evaluation of a recent fall. 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 12 hours ago, he was carrying heavy equipment, climbing up the ladder to the roof with his coworker when he loss his balance and fell down on the ground striking head against ground. He described carrying long poles which require 2 workers to carry up side by side using two ladder.  His coworker climb faster than he did, causing him to fall.  He believe the ladder may have fallen on top of him once he hits the ground. Pt felt he may have fallen approximately 6-7 feet. Sts he may have a brief transient LOC for a few seconds.  He report pain to his bilateral temporal region, and also sts he bit his tongue.  Pain is 8/10, throbbing, waxing and waning but non radiating.  His significant other has noticed that pt appears more confused, and forgetful since.  He also report blowing his nose out an hr ago and there were trace of blood in it.  Sts he has had nasal congestions for the past few days, but denies having nose bleed.  Pt also suffered a cut to his L jaw line.  He report tingling and pain to the affected area, worsening with head movement.  He suffered an abrasion to L knee but denies any significant knee pain.  He doesn't think he suffered any broken bones.  Denies any cp, sob, abd pain, back pain, hip pain, ankles or foot pain.  Denies any precipitating sxs prior to the fall. Denies alcohol or recent drug use.  Pt report his HIV is under controlled, with viral load undetectable during last visit a month ago.  He has been compliant with his HIV medication.    Past Medical History:  Diagnosis Date  . 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 HIV (human immunodeficiency virus infection) (HCC)   . Legal problem 04/09/2016    Patient Active Problem List   Diagnosis Date Noted  . 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 Legal problem 04/09/2016  . HIV disease (HCC) 03/20/2016    Past Surgical History:  Procedure Laterality Date  . HAND SURGERY         Home Medications    Prior to Admission medications   Medication Sig Start Date End Date Taking? Authorizing Provider  elvitegravir-cobicistat-emtricitabine-tenofovir (GENVOYA) 150-150-200-10 MG TABS tablet Take 1 tablet by mouth daily with breakfast. 04/09/16   Randall Hissornelius N Van Dam, MD    Family History History reviewed. No pertinent family history.  Social History Social History  Substance Use Topics  . Smoking status: Current Every Day Smoker    Packs/day: 0.50    Types: Cigarettes  . Smokeless tobacco: Never Used  . Alcohol use No     Allergies   Review of patient's allergies indicates no known allergies.   Review of Systems Review of Systems  All other systems reviewed and are negative.    Physical Exam Updated Vital Signs BP 138/85 (BP Location: Right Arm)   Pulse 66   Temp 97.7 F (36.5 C) (Oral)   Resp 18   Ht 5'  7" (1.702 m)   Wt 63.5 kg   SpO2 100%   BMI 21.93 kg/m   Physical Exam  Constitutional: He is oriented to person, place, and time. He appears well-developed and well-nourished. No distress.  Pt appears in no acute distress, ambulating around room without difficulty  HENT:  Head: Normocephalic.  Tenderness to L orbital rim with mild swelling but no crepitus or bruising.  Small superficial abrasion noted to tip of tongue, mild trismus.  No hemotympanum, no septal hematoma, no malocclusion and no significant mid face tenderness.  No raccoon's eyes or battle sign.    Eyes: Conjunctivae are normal.  Neck: Normal range of motion. Neck supple.  No significant midline spine tenderness, crepitus or step off.   Cardiovascular: Normal rate and regular rhythm.   Pulmonary/Chest: Effort normal and breath sounds normal. He exhibits no tenderness.  Abdominal: Soft. He exhibits no distension. There is no tenderness.  Musculoskeletal: Normal range of motion. He exhibits tenderness (L knee: dry blood noted to anterior knee with small abrasion.  normal knee flexion/extension.  no joint laxity.).  No feet tenderness or ankle tenderness on exam.  Hips/pelvis are stable  Neurological: He is alert and oriented to person, place, and time. He has normal strength. GCS eye subscore is 4. GCS verbal subscore is 5. GCS motor subscore is 6.  No focal neuro deficits on exam.  Pt lay with eyes closed, speech is mildly delay.  Pt is A&O x4  Skin: No rash noted.  Psychiatric: He has a normal mood and affect.  Nursing note and vitals reviewed.    ED Treatments / Results  Labs (all labs ordered are listed, but only abnormal results are displayed) Labs Reviewed - No data to display  EKG  EKG Interpretation None       Radiology Ct Head Wo Contrast  Result Date: 06/05/2016 CLINICAL DATA:  20 year old male with fall EXAM: CT HEAD WITHOUT CONTRAST CT MAXILLOFACIAL WITHOUT CONTRAST CT CERVICAL SPINE WITHOUT CONTRAST TECHNIQUE: Multidetector CT imaging of the head, cervical spine, and maxillofacial structures were performed using the standard protocol without intravenous contrast. Multiplanar CT image reconstructions of the cervical spine and maxillofacial structures were also generated. COMPARISON:  CT dated 11/24/2013 FINDINGS: CT HEAD FINDINGS Brain: No evidence of acute infarction, hemorrhage, hydrocephalus, extra-axial collection or mass lesion/mass effect. Vascular: No hyperdense vessel or unexpected calcification. Skull: Normal. Negative for fracture or focal lesion. Sinuses/Orbits: Mild mucoperiosteal thickening of paranasal sinuses. No air-fluid levels. The mastoid air cells are clear. Other: None CT MAXILLOFACIAL  FINDINGS Osseous: No fracture or mandibular dislocation. No destructive process. Orbits: Negative. No traumatic or inflammatory finding. Sinuses: There is diffuse mucoperiosteal thickening of paranasal sinuses. No air-fluid levels. Mastoid air cells are clear. Soft tissues: Negative. CT CERVICAL SPINE FINDINGS Alignment: Normal. Skull base and vertebrae: No acute fracture. No primary bone lesion or focal pathologic process. Soft tissues and spinal canal: No prevertebral fluid or swelling. No visible canal hematoma. Upper chest: Negative. Other: None IMPRESSION: No acute intracranial pathology. No acute facial bone fractures. No acute/traumatic cervical spine pathology. Electronically Signed   By: Elgie Collard M.D.   On: 06/05/2016 01:23   Ct Cervical Spine Wo Contrast  Result Date: 06/05/2016 CLINICAL DATA:  20 year old male with fall EXAM: CT HEAD WITHOUT CONTRAST CT MAXILLOFACIAL WITHOUT CONTRAST CT CERVICAL SPINE WITHOUT CONTRAST TECHNIQUE: Multidetector CT imaging of the head, cervical spine, and maxillofacial structures were performed using the standard protocol without intravenous contrast. Multiplanar CT image reconstructions of the  cervical spine and maxillofacial structures were also generated. COMPARISON:  CT dated 11/24/2013 FINDINGS: CT HEAD FINDINGS Brain: No evidence of acute infarction, hemorrhage, hydrocephalus, extra-axial collection or mass lesion/mass effect. Vascular: No hyperdense vessel or unexpected calcification. Skull: Normal. Negative for fracture or focal lesion. Sinuses/Orbits: Mild mucoperiosteal thickening of paranasal sinuses. No air-fluid levels. The mastoid air cells are clear. Other: None CT MAXILLOFACIAL FINDINGS Osseous: No fracture or mandibular dislocation. No destructive process. Orbits: Negative. No traumatic or inflammatory finding. Sinuses: There is diffuse mucoperiosteal thickening of paranasal sinuses. No air-fluid levels. Mastoid air cells are clear. Soft tissues:  Negative. CT CERVICAL SPINE FINDINGS Alignment: Normal. Skull base and vertebrae: No acute fracture. No primary bone lesion or focal pathologic process. Soft tissues and spinal canal: No prevertebral fluid or swelling. No visible canal hematoma. Upper chest: Negative. Other: None IMPRESSION: No acute intracranial pathology. No acute facial bone fractures. No acute/traumatic cervical spine pathology. Electronically Signed   By: Elgie Collard M.D.   On: 06/05/2016 01:23   Ct Maxillofacial Wo Contrast  Result Date: 06/05/2016 CLINICAL DATA:  20 year old male with fall EXAM: CT HEAD WITHOUT CONTRAST CT MAXILLOFACIAL WITHOUT CONTRAST CT CERVICAL SPINE WITHOUT CONTRAST TECHNIQUE: Multidetector CT imaging of the head, cervical spine, and maxillofacial structures were performed using the standard protocol without intravenous contrast. Multiplanar CT image reconstructions of the cervical spine and maxillofacial structures were also generated. COMPARISON:  CT dated 11/24/2013 FINDINGS: CT HEAD FINDINGS Brain: No evidence of acute infarction, hemorrhage, hydrocephalus, extra-axial collection or mass lesion/mass effect. Vascular: No hyperdense vessel or unexpected calcification. Skull: Normal. Negative for fracture or focal lesion. Sinuses/Orbits: Mild mucoperiosteal thickening of paranasal sinuses. No air-fluid levels. The mastoid air cells are clear. Other: None CT MAXILLOFACIAL FINDINGS Osseous: No fracture or mandibular dislocation. No destructive process. Orbits: Negative. No traumatic or inflammatory finding. Sinuses: There is diffuse mucoperiosteal thickening of paranasal sinuses. No air-fluid levels. Mastoid air cells are clear. Soft tissues: Negative. CT CERVICAL SPINE FINDINGS Alignment: Normal. Skull base and vertebrae: No acute fracture. No primary bone lesion or focal pathologic process. Soft tissues and spinal canal: No prevertebral fluid or swelling. No visible canal hematoma. Upper chest: Negative. Other:  None IMPRESSION: No acute intracranial pathology. No acute facial bone fractures. No acute/traumatic cervical spine pathology. Electronically Signed   By: Elgie Collard M.D.   On: 06/05/2016 01:23    Procedures Procedures (including critical care time)  Medications Ordered in ED Medications - No data to display   Initial Impression / Assessment and Plan / ED Course  I have reviewed the triage vital signs and the nursing notes.  Pertinent labs & imaging results that were available during my care of the patient were reviewed by me and considered in my medical decision making (see chart for details).  Clinical Course    BP 132/80   Pulse (!) 57   Temp 97.7 F (36.5 C) (Oral)   Resp 16   Ht 5\' 7"  (1.702 m)   Wt 63.5 kg   SpO2 99%   BMI 21.93 kg/m    Final Clinical Impressions(s) / ED Diagnoses   Final diagnoses:  Minor head injury, initial encounter  Concussion, with loss of consciousness of 30 minutes or less, initial encounter  Face lacerations, initial encounter    New Prescriptions New Prescriptions   No medications on file   Pt fell off ladder approximately 6-7 feet in height.  Small cut to L jaw line, appears superficial not requiring sutures.  Abrasion to L  anterior knee without significant injury.  Pt appears to have sxs consistent with a concussion.  Will obtain head/neck CT to r/o significant intracranial or bony injury.  Tetanus is UTD.  Pain medication given.   1:55 AM CT scan of head, neck, and maxillofacial shows no acute fractures or dislocation. Patient does have symptoms of concussion. I recommend rest for the next several days, and avoid any contact sport that can worsen his concussion. Does have history of drug use in the past before I will avoid described any medical pain medication. Return precautions discussed. Patient is stable for discharge.   Fayrene Helper, PA-C 06/05/16 0205    Raeford Razor, MD 06/06/16 [phone]

## 2016-06-05 NOTE — ED Notes (Signed)
Patient able to ambulate independently  

## 2016-06-11 ENCOUNTER — Other Ambulatory Visit: Payer: Medicaid Other

## 2016-06-12 ENCOUNTER — Other Ambulatory Visit (INDEPENDENT_AMBULATORY_CARE_PROVIDER_SITE_OTHER): Payer: Self-pay

## 2016-06-12 DIAGNOSIS — B20 Human immunodeficiency virus [HIV] disease: Secondary | ICD-10-CM

## 2016-06-12 DIAGNOSIS — Z113 Encounter for screening for infections with a predominantly sexual mode of transmission: Secondary | ICD-10-CM

## 2016-06-12 LAB — CBC WITH DIFFERENTIAL/PLATELET
BASOS PCT: 1 %
Basophils Absolute: 43 cells/uL (ref 0–200)
EOS PCT: 5 %
Eosinophils Absolute: 215 cells/uL (ref 15–500)
HEMATOCRIT: 42.6 % (ref 38.5–50.0)
HEMOGLOBIN: 14.7 g/dL (ref 13.2–17.1)
LYMPHS ABS: 2193 {cells}/uL (ref 850–3900)
LYMPHS PCT: 51 %
MCH: 31.1 pg (ref 27.0–33.0)
MCHC: 34.5 g/dL (ref 32.0–36.0)
MCV: 90.1 fL (ref 80.0–100.0)
MONO ABS: 344 {cells}/uL (ref 200–950)
MPV: 8.8 fL (ref 7.5–12.5)
Monocytes Relative: 8 %
NEUTROS PCT: 35 %
Neutro Abs: 1505 cells/uL (ref 1500–7800)
Platelets: 255 10*3/uL (ref 140–400)
RBC: 4.73 MIL/uL (ref 4.20–5.80)
RDW: 13.8 % (ref 11.0–15.0)
WBC: 4.3 10*3/uL (ref 3.8–10.8)

## 2016-06-13 LAB — COMPLETE METABOLIC PANEL WITH GFR
ALT: 9 U/L (ref 9–46)
AST: 21 U/L (ref 10–40)
Albumin: 4.3 g/dL (ref 3.6–5.1)
Alkaline Phosphatase: 105 U/L (ref 40–115)
BUN: 13 mg/dL (ref 7–25)
CHLORIDE: 104 mmol/L (ref 98–110)
CO2: 27 mmol/L (ref 20–31)
CREATININE: 0.86 mg/dL (ref 0.60–1.35)
Calcium: 9.4 mg/dL (ref 8.6–10.3)
GFR, Est African American: >89 mL/min (ref >=60)
GFR, Est Non African American: >89 mL/min (ref >=60)
GLUCOSE: 100 mg/dL — AB (ref 65–99)
POTASSIUM: 4.3 mmol/L (ref 3.5–5.3)
SODIUM: 140 mmol/L (ref 135–146)
Total Bilirubin: 0.6 mg/dL (ref 0.2–1.2)
Total Protein: 7.3 g/dL (ref 6.1–8.1)

## 2016-06-13 LAB — T-HELPER CELL (CD4) - (RCID CLINIC ONLY)
CD4 % Helper T Cell: 32 % — ABNORMAL LOW (ref 33–55)
CD4 T Cell Abs: 750 /uL (ref 400–2700)

## 2016-06-13 LAB — RPR

## 2016-06-15 LAB — HIV RNA, RTPCR W/R GT (RTI, PI,INT): HIV 1 RNA Quant: <20 copies/mL

## 2016-06-25 ENCOUNTER — Encounter: Payer: Self-pay | Admitting: Infectious Disease

## 2016-06-25 ENCOUNTER — Ambulatory Visit (INDEPENDENT_AMBULATORY_CARE_PROVIDER_SITE_OTHER): Payer: Self-pay | Admitting: Infectious Disease

## 2016-06-25 VITALS — BP 123/81 | HR 72 | Temp 97.9°F | Wt 121.0 lb

## 2016-06-25 DIAGNOSIS — Z87898 Personal history of other specified conditions: Secondary | ICD-10-CM

## 2016-06-25 DIAGNOSIS — F1991 Other psychoactive substance use, unspecified, in remission: Secondary | ICD-10-CM

## 2016-06-25 DIAGNOSIS — B20 Human immunodeficiency virus [HIV] disease: Secondary | ICD-10-CM

## 2016-06-25 DIAGNOSIS — S060X9A Concussion with loss of consciousness of unspecified duration, initial encounter: Secondary | ICD-10-CM

## 2016-06-25 DIAGNOSIS — R112 Nausea with vomiting, unspecified: Secondary | ICD-10-CM

## 2016-06-25 DIAGNOSIS — Z23 Encounter for immunization: Secondary | ICD-10-CM

## 2016-06-25 HISTORY — DX: Concussion with loss of consciousness of unspecified duration, initial encounter: S06.0X9A

## 2016-06-25 HISTORY — DX: Nausea with vomiting, unspecified: R11.2

## 2016-06-25 MED ORDER — ABACAVIR-DOLUTEGRAVIR-LAMIVUD 600-50-300 MG PO TABS: 1.0000 | ORAL_TABLET | Freq: Every day | ORAL | 11 refills | Status: DC

## 2016-06-25 NOTE — Progress Notes (Signed)
Chief complaint : followup for HIV disease  Subjective:    Patient ID: Jerry Finley, male    DOB: 1995-11-28, 20 y.o.   MRN: [id number]  HPI  This is a 20 year old Caucasian man who has been diagnosed with HIV within the last year. He spent much of this time in jail for having violated probation for a break and related to his substance abuse problems. He has a history of prior intravenous heroin use and he believes that is how he contracted HIV he does not have hepatitis C by labs.  He has been maintained on GENVOYA with nice virological control.  He states that his heroin use is in remission and any will not use again.   He states that he has been clean since about a year prior to incarceration. He states that he and his current wife got into IV narcotics together but helped pull eachother out of it as well. He now has a 182 month old child. 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 He has lost weight while working but has no other symptoms due to this.  He states that sometimes when he takes [place] after working he has nausea with vomiting which bothers him.  Past Medical History:  Diagnosis Date  . 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 HIV (human immunodeficiency virus infection) (HCC)   . Legal problem 04/09/2016    Past Surgical History:  Procedure Laterality Date  . HAND SURGERY      No family history on file.    Social History   Social History  . Marital status: Single    Spouse name: N/A  . Number of children: N/A  . Years of education: N/A   Social History Main Topics  . Smoking status: Current Every Day Smoker    Packs/day: 0.50    Types: Cigarettes  . Smokeless tobacco: Never Used  . Alcohol use No  . Drug use: No  . Sexual activity: Yes    Partners: Female   Other Topics Concern  . None   Social History Narrative  . None    No Known Allergies   Current Outpatient Prescriptions:  .   elvitegravir-cobicistat-emtricitabine-tenofovir (GENVOYA) 150-150-200-10 MG TABS tablet, Take 1 tablet by mouth daily with breakfast., Disp: 30 tablet, Rfl: 11    Review of Systems  Constitutional: Negative for activity change, appetite change, chills, diaphoresis, fatigue, fever and unexpected weight change.  HENT: Negative for congestion, rhinorrhea, sinus pressure, sneezing, sore throat and trouble swallowing.   Eyes: Negative for photophobia and visual disturbance.  Respiratory: Negative for cough, chest tightness, shortness of breath, wheezing and stridor.   Cardiovascular: Negative for chest pain, palpitations and leg swelling.  Gastrointestinal: Negative for abdominal distention, abdominal pain, anal bleeding, blood in stool, constipation, diarrhea, nausea and vomiting.  Genitourinary: Negative for difficulty urinating, dysuria, flank pain and hematuria.  Musculoskeletal: Negative for arthralgias, back pain, gait problem, joint swelling and myalgias.  Skin: Negative for color change, pallor, rash and wound.  Neurological: Negative for dizziness, tremors, weakness and light-headedness.  Hematological: Negative for adenopathy. Does not bruise/bleed easily.  Psychiatric/Behavioral: Negative for agitation, behavioral problems, confusion, decreased concentration, dysphoric mood and sleep disturbance.       Objective:   Physical Exam  Constitutional: He is oriented to person, place, and time. He appears well-developed and well-nourished.  HENT:  Head: Normocephalic and atraumatic.  Eyes: Conjunctivae and EOM are normal.  Neck:  Normal range of motion. Neck supple.  Cardiovascular: Normal rate and regular rhythm.   Pulmonary/Chest: Effort normal. No respiratory distress. He has no wheezes.  Abdominal: Soft. He exhibits no distension.  Musculoskeletal: Normal range of motion. He exhibits no edema or tenderness.  Neurological: He is alert and oriented to person, place, and time.  Skin:  Skin is warm and dry. No rash noted. No erythema. No pallor.  Psychiatric: He has a normal mood and affect. His behavior is normal. Judgment and thought content normal.          Assessment & Plan:   #1 HIV disease: Switch to [name]  And check labs in  One month. See if this helps with his nausea  #2 IV drug use history in remission: Counseled him on the risk of recurrence including transmission of other infectious diseases such as hepatitis C and also risk for infectious endocarditis which cannot be obviated by not sharing needles. He is adamant he will not return to using again  #3 Nausea: ? Truly related to [facility] but will switch and see if this helps  #4 Concussion: has had no long term sequelae. Occurred with fall from a ladder  I spent greater than  40  minutes with the patient including greater than 50% of time in face to face counsel of the patient re his HIV his hx of IVDU, his nausea, his concussion   and in coordination of his care.

## 2016-07-26 ENCOUNTER — Other Ambulatory Visit: Payer: Self-pay

## 2016-08-09 ENCOUNTER — Other Ambulatory Visit (INDEPENDENT_AMBULATORY_CARE_PROVIDER_SITE_OTHER): Payer: Self-pay

## 2016-08-09 DIAGNOSIS — B20 Human immunodeficiency virus [HIV] disease: Secondary | ICD-10-CM

## 2016-08-09 LAB — COMPLETE METABOLIC PANEL WITH GFR
ALK PHOS: 78 U/L (ref 40–115)
ALT: 15 U/L (ref 9–46)
AST: 26 U/L (ref 10–40)
Albumin: 4 g/dL (ref 3.6–5.1)
BILIRUBIN TOTAL: 0.3 mg/dL (ref 0.2–1.2)
BUN: 11 mg/dL (ref 7–25)
CALCIUM: 9.2 mg/dL (ref 8.6–10.3)
CHLORIDE: 104 mmol/L (ref 98–110)
CO2: 27 mmol/L (ref 20–31)
CREATININE: 0.82 mg/dL (ref 0.60–1.35)
GFR, Est Non African American: >89 mL/min (ref >=60)
Glucose, Bld: 92 mg/dL (ref 65–99)
Potassium: 4.1 mmol/L (ref 3.5–5.3)
Sodium: 139 mmol/L (ref 135–146)
TOTAL PROTEIN: 6.7 g/dL (ref 6.1–8.1)

## 2016-08-10 LAB — T-HELPER CELL (CD4) - (RCID CLINIC ONLY)
CD4 T CELL HELPER: 40 % (ref 33–55)
CD4 T Cell Abs: 990 /uL (ref 400–2700)

## 2016-08-13 LAB — HIV-1 RNA ULTRAQUANT REFLEX TO GENTYP+: HIV-1 RNA Quant, Log: <1.3 Log copies/mL (ref <1.30)

## 2016-08-14 ENCOUNTER — Ambulatory Visit (INDEPENDENT_AMBULATORY_CARE_PROVIDER_SITE_OTHER): Payer: Self-pay | Admitting: Infectious Disease

## 2016-08-14 ENCOUNTER — Encounter: Payer: Self-pay | Admitting: Infectious Disease

## 2016-08-14 ENCOUNTER — Encounter (INDEPENDENT_AMBULATORY_CARE_PROVIDER_SITE_OTHER): Payer: Self-pay

## 2016-08-14 VITALS — BP 108/69 | HR 69 | Temp 97.9°F | Wt 123.0 lb

## 2016-08-14 DIAGNOSIS — B20 Human immunodeficiency virus [HIV] disease: Secondary | ICD-10-CM

## 2016-08-14 DIAGNOSIS — F1991 Other psychoactive substance use, unspecified, in remission: Secondary | ICD-10-CM

## 2016-08-14 DIAGNOSIS — R112 Nausea with vomiting, unspecified: Secondary | ICD-10-CM

## 2016-08-14 DIAGNOSIS — Z23 Encounter for immunization: Secondary | ICD-10-CM

## 2016-08-14 DIAGNOSIS — Z87898 Personal history of other specified conditions: Secondary | ICD-10-CM

## 2016-08-14 NOTE — Patient Instructions (Signed)
  u you need and then in on month's time n 6 months ime    I ld also like you to come  To receive your vaciein January to also 2 re-enroll in [facility]

## 2016-08-14 NOTE — Progress Notes (Signed)
Chief complaint : followup for HIV disease  Subjective:    Patient ID: Jerry Finley, male    DOB: 02-07-1996, 20 y.o.   MRN: [id number]  HPI  This is a 20 year old Caucasian man who has been diagnosed with HIV within the last year. He spent much of this time in jail for having violated probation for a break and related to his substance abuse problems. He has a history of prior intravenous heroin use and he believes that is how he contracted HIV he does not have hepatitis C by labs.  He has been maintained on GENVOYA with nice virological control.  He states that his heroin use is in remission and any will not use again.   He states that he has been clean since about a year prior to incarceration. He states that he and his current wife got into IV narcotics together but helped pull eachother out of it as well. He now has a 882 month old child. 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 He has lost weight while working but has no other symptoms due to this.  As he was suffering from some nausea and vomiting with [facility]YA [facility] and he has maintained nice virological suppression and is tolerating his new regimen quite well. He remains clean of intravenous drugs and is adamant he will never go back to using again.  Lab Results  Component Value Date   HIV1RNAQUANT <20 08/09/2016   HIV1RNAQUANT <20 06/12/2016   HIV1RNAQUANT <20 03/19/2016   Lab Results  Component Value Date   CD4TABS 990 08/09/2016   CD4TABS 750 06/12/2016   CD4TABS 570 03/19/2016     Past Medical History:  Diagnosis Date  . Concussion with loss of consciousness 06/25/2016  . 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 HIV (human immunodeficiency virus infection) (HCC)   . Legal problem 04/09/2016  . Nausea with vomiting 06/25/2016    Past Surgical History:  Procedure Laterality Date  . HAND SURGERY      No family history on file.      Social History   Social History  . Marital status: Single    Spouse name: N/A  . Number of children: N/A  . Years of education: N/A   Social History Main Topics  . Smoking status: Current Every Day Smoker    Packs/day: 0.50    Types: Cigarettes  . Smokeless tobacco: Never Used  . Alcohol use No  . Drug use: No  . Sexual activity: Yes    Partners: Female   Other Topics Concern  . None   Social History Narrative  . None    No Known Allergies   Current Outpatient Prescriptions:  .  abacavir-dolutegravir-lamiVUDine ([name]) 600-50-300 MG tablet, Take 1 tablet by mouth daily., Disp: 30 tablet, Rfl: 11    Review of Systems  Constitutional: Negative for activity change, appetite change, chills, diaphoresis, fatigue, fever and unexpected weight change.  HENT: Negative for congestion, rhinorrhea, sinus pressure, sneezing, sore throat and trouble swallowing.   Eyes: Negative for photophobia and visual disturbance.  Respiratory: Negative for cough, chest tightness, shortness of breath, wheezing and stridor.   Cardiovascular: Negative for chest pain, palpitations and leg swelling.  Gastrointestinal: Negative for abdominal distention, abdominal pain, anal bleeding, blood in stool, constipation, diarrhea, nausea and vomiting.  Genitourinary: Negative for difficulty urinating, dysuria, flank pain and hematuria.  Musculoskeletal: Negative for arthralgias, back pain, gait  problem, joint swelling and myalgias.  Skin: Negative for color change, pallor, rash and wound.  Neurological: Negative for dizziness, tremors, weakness and light-headedness.  Hematological: Negative for adenopathy. Does not bruise/bleed easily.  Psychiatric/Behavioral: Negative for agitation, behavioral problems, confusion, decreased concentration, dysphoric mood and sleep disturbance.       Objective:   Physical Exam  Constitutional: He is oriented to person, place, and time. He appears well-developed and  well-nourished.  HENT:  Head: Normocephalic and atraumatic.  Eyes: Conjunctivae and EOM are normal.  Neck: Normal range of motion. Neck supple.  Cardiovascular: Normal rate and regular rhythm.   Pulmonary/Chest: Effort normal. No respiratory distress. He has no wheezes.  Abdominal: Soft. He exhibits no distension.  Musculoskeletal: Normal range of motion. He exhibits no edema or tenderness.  Neurological: He is alert and oriented to person, place, and time.  Skin: Skin is warm and dry. No rash noted. No erythema. No pallor.  Psychiatric: He has a normal mood and affect. His behavior is normal. Judgment and thought content normal.          Assessment & Plan:   #1 HIV disease:Continue with [name]  see back in roughly 3 months  #2 IV drug use history in remission: Counseled him on the risk of recurrence including transmission of other infectious diseases such as hepatitis C and also risk for infectious endocarditis which cannot be obviated by not sharing needles. He is adamant he will not return to using again  #3 Nausea: His all with coming off of GENVOYA   #4 Concussion: has had no long term sequelae. Occurred with fall from a ladder  I spent greater than  25 minutes with the patient including greater than 50% of time in face to face counsel of the patient re his HIV his hx of IVDU, his nausea, his    and in coordination of his care.

## 2016-08-14 NOTE — Addendum Note (Signed)
Addended by: Alesia MorinPOOLE, TRAVIS F on: 08/14/2016 05:30 PM   Modules accepted: Orders

## 2016-11-06 ENCOUNTER — Other Ambulatory Visit (INDEPENDENT_AMBULATORY_CARE_PROVIDER_SITE_OTHER): Payer: Self-pay

## 2016-11-06 DIAGNOSIS — B20 Human immunodeficiency virus [HIV] disease: Secondary | ICD-10-CM

## 2016-11-06 DIAGNOSIS — Z79899 Other long term (current) drug therapy: Secondary | ICD-10-CM

## 2016-11-06 DIAGNOSIS — Z113 Encounter for screening for infections with a predominantly sexual mode of transmission: Secondary | ICD-10-CM

## 2016-11-06 LAB — CBC WITH DIFFERENTIAL/PLATELET
Basophils Absolute: 0 cells/uL (ref 0–200)
Basophils Relative: 0 %
EOS PCT: 2 %
Eosinophils Absolute: 202 cells/uL (ref 15–500)
HCT: 38.8 % (ref 38.5–50.0)
Hemoglobin: 13.1 g/dL — ABNORMAL LOW (ref 13.2–17.1)
LYMPHS ABS: 2525 {cells}/uL (ref 850–3900)
LYMPHS PCT: 25 %
MCH: 30.9 pg (ref 27.0–33.0)
MCHC: 33.8 g/dL (ref 32.0–36.0)
MCV: 91.5 fL (ref 80.0–100.0)
MONOS PCT: 7 %
MPV: 8.9 fL (ref 7.5–12.5)
Monocytes Absolute: 707 cells/uL (ref 200–950)
Neutro Abs: 6666 cells/uL (ref 1500–7800)
Neutrophils Relative %: 66 %
PLATELETS: 270 10*3/uL (ref 140–400)
RBC: 4.24 MIL/uL (ref 4.20–5.80)
RDW: 14.8 % (ref 11.0–15.0)
WBC: 10.1 10*3/uL (ref 3.8–10.8)

## 2016-11-06 LAB — COMPLETE METABOLIC PANEL WITH GFR
ALT: 10 U/L (ref 9–46)
AST: 20 U/L (ref 10–40)
Albumin: 3.9 g/dL (ref 3.6–5.1)
Alkaline Phosphatase: 100 U/L (ref 40–115)
BUN: 17 mg/dL (ref 7–25)
CHLORIDE: 102 mmol/L (ref 98–110)
CO2: 25 mmol/L (ref 20–31)
Calcium: 8.8 mg/dL (ref 8.6–10.3)
Creat: 0.89 mg/dL (ref 0.60–1.35)
GFR, Est African American: >89 mL/min (ref >=60)
GFR, Est Non African American: >89 mL/min (ref >=60)
GLUCOSE: 113 mg/dL — AB (ref 65–99)
POTASSIUM: 4.3 mmol/L (ref 3.5–5.3)
SODIUM: 137 mmol/L (ref 135–146)
Total Bilirubin: 0.3 mg/dL (ref 0.2–1.2)
Total Protein: 7 g/dL (ref 6.1–8.1)

## 2016-11-06 LAB — LIPID PANEL
CHOL/HDL RATIO: 3.8 ratio (ref <5.0)
Cholesterol: 117 mg/dL (ref <200)
HDL: 31 mg/dL — ABNORMAL LOW (ref >40)
LDL CALC: 64 mg/dL (ref <100)
TRIGLYCERIDES: 110 mg/dL (ref <150)
VLDL: 22 mg/dL (ref <30)

## 2016-11-07 LAB — RPR

## 2016-11-08 ENCOUNTER — Encounter: Payer: Self-pay | Admitting: Infectious Disease

## 2016-11-08 LAB — HIV-1 RNA QUANT-NO REFLEX-BLD
HIV 1 RNA QUANT: NOT DETECTED {copies}/mL
HIV-1 RNA QUANT, LOG: NOT DETECTED {Log_copies}/mL

## 2016-11-08 LAB — T-HELPER CELL (CD4) - (RCID CLINIC ONLY)
CD4 T CELL ABS: 1070 /uL (ref 400–2700)
CD4 T CELL HELPER: 38 % (ref 33–55)

## 2016-11-20 ENCOUNTER — Encounter (INDEPENDENT_AMBULATORY_CARE_PROVIDER_SITE_OTHER): Payer: Self-pay

## 2016-11-20 ENCOUNTER — Ambulatory Visit (INDEPENDENT_AMBULATORY_CARE_PROVIDER_SITE_OTHER): Payer: Self-pay | Admitting: Infectious Disease

## 2016-11-20 ENCOUNTER — Encounter: Payer: Self-pay | Admitting: Infectious Disease

## 2016-11-20 VITALS — BP 143/83 | HR 72 | Temp 98.2°F | Ht 67.0 in | Wt 125.0 lb

## 2016-11-20 DIAGNOSIS — Z87898 Personal history of other specified conditions: Secondary | ICD-10-CM

## 2016-11-20 DIAGNOSIS — B20 Human immunodeficiency virus [HIV] disease: Secondary | ICD-10-CM

## 2016-11-20 DIAGNOSIS — F1991 Other psychoactive substance use, unspecified, in remission: Secondary | ICD-10-CM

## 2016-11-20 DIAGNOSIS — F17200 Nicotine dependence, unspecified, uncomplicated: Secondary | ICD-10-CM

## 2016-11-20 DIAGNOSIS — Z23 Encounter for immunization: Secondary | ICD-10-CM

## 2016-11-20 HISTORY — DX: Nicotine dependence, unspecified, uncomplicated: F17.200

## 2016-11-20 MED ORDER — AMBULATORY NON FORMULARY MEDICATION: 11 refills | Status: DC

## 2016-11-20 NOTE — Addendum Note (Signed)
Addended by: Linnell FullingBRANNON, Jagjit Riner N on: 11/20/2016 03:45 PM   Modules accepted: Orders

## 2016-11-20 NOTE — Patient Instructions (Signed)
We will call in new Rx for Biktarvy  Finish up your month of [name] then start BIKTARVY  After one month on BIKTARVY come back for labs with us and visit with me 2 weeks later
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
## 2016-11-20 NOTE — Addendum Note (Signed)
Addended by: Randall HissVAN DAM, CORNELIUS N on: 11/20/2016 03:58 PM   Modules accepted: Orders

## 2017-01-30 ENCOUNTER — Ambulatory Visit: Payer: Self-pay | Admitting: Infectious Disease

## 2017-03-04 ENCOUNTER — Encounter: Payer: Self-pay | Admitting: Infectious Disease

## 2017-03-04 ENCOUNTER — Ambulatory Visit (INDEPENDENT_AMBULATORY_CARE_PROVIDER_SITE_OTHER): Payer: Self-pay | Admitting: Infectious Disease

## 2017-03-04 VITALS — BP 119/69 | HR 61 | Temp 98.3°F | Wt 128.0 lb

## 2017-03-04 DIAGNOSIS — F1991 Other psychoactive substance use, unspecified, in remission: Secondary | ICD-10-CM

## 2017-03-04 DIAGNOSIS — F17200 Nicotine dependence, unspecified, uncomplicated: Secondary | ICD-10-CM

## 2017-03-04 DIAGNOSIS — B20 Human immunodeficiency virus [HIV] disease: Secondary | ICD-10-CM

## 2017-03-04 DIAGNOSIS — Z23 Encounter for immunization: Secondary | ICD-10-CM

## 2017-03-04 DIAGNOSIS — Z87898 Personal history of other specified conditions: Secondary | ICD-10-CM

## 2017-03-04 LAB — CBC WITH DIFFERENTIAL/PLATELET
BASOS PCT: 0 %
Basophils Absolute: 0 cells/uL (ref 0–200)
Eosinophils Absolute: 208 cells/uL (ref 15–500)
Eosinophils Relative: 4 %
HEMATOCRIT: 38.3 % — AB (ref 38.5–50.0)
Hemoglobin: 12.9 g/dL — ABNORMAL LOW (ref 13.2–17.1)
LYMPHS ABS: 2548 {cells}/uL (ref 850–3900)
LYMPHS PCT: 49 %
MCH: 31.2 pg (ref 27.0–33.0)
MCHC: 33.7 g/dL (ref 32.0–36.0)
MCV: 92.7 fL (ref 80.0–100.0)
MONO ABS: 416 {cells}/uL (ref 200–950)
MPV: 8.7 fL (ref 7.5–12.5)
Monocytes Relative: 8 %
NEUTROS ABS: 2028 {cells}/uL (ref 1500–7800)
Neutrophils Relative %: 39 %
Platelets: 224 10*3/uL (ref 140–400)
RBC: 4.13 MIL/uL — AB (ref 4.20–5.80)
RDW: 14.1 % (ref 11.0–15.0)
WBC: 5.2 10*3/uL (ref 3.8–10.8)

## 2017-03-04 LAB — COMPLETE METABOLIC PANEL WITH GFR
ALT: 14 U/L (ref 9–46)
AST: 30 U/L (ref 10–40)
Albumin: 4 g/dL (ref 3.6–5.1)
Alkaline Phosphatase: 90 U/L (ref 40–115)
BUN: 14 mg/dL (ref 7–25)
CALCIUM: 9.1 mg/dL (ref 8.6–10.3)
CHLORIDE: 107 mmol/L (ref 98–110)
CO2: 24 mmol/L (ref 20–31)
CREATININE: 0.88 mg/dL (ref 0.60–1.35)
Glucose, Bld: 77 mg/dL (ref 65–99)
POTASSIUM: 4.2 mmol/L (ref 3.5–5.3)
Sodium: 139 mmol/L (ref 135–146)
Total Bilirubin: 0.3 mg/dL (ref 0.2–1.2)
Total Protein: 6.3 g/dL (ref 6.1–8.1)

## 2017-03-04 MED ORDER — BICTEGRAVIR-EMTRICITAB-TENOFOV 50-200-25 MG PO TABS: 1.0000 | ORAL_TABLET | Freq: Every day | ORAL | 11 refills | Status: DC

## 2017-03-04 MED ORDER — HPV QUADRIVALENT VACCINE IM SUSP: 0.5000 mL | Freq: Once | INTRAMUSCULAR | 0 refills | Status: DC

## 2017-03-04 NOTE — Patient Instructions (Addendum)
Make an appt with ID pharmacy one month after [facility] to recheck labs and see how you are tolerating the medication++++++++++++++++++
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
## 2017-03-05 LAB — RPR

## 2017-03-05 LAB — T-HELPER CELL (CD4) - (RCID CLINIC ONLY)
CD4 T CELL HELPER: 42 % (ref 33–55)
CD4 T Cell Abs: 1160 /uL (ref 400–2700)

## 2017-03-06 LAB — HIV-1 RNA QUANT-NO REFLEX-BLD
HIV 1 RNA QUANT: DETECTED {copies}/mL — AB
HIV-1 RNA QUANT, LOG: DETECTED {Log_copies}/mL — AB

## 2017-03-25 ENCOUNTER — Ambulatory Visit: Payer: Self-pay

## 2017-05-06 ENCOUNTER — Ambulatory Visit (INDEPENDENT_AMBULATORY_CARE_PROVIDER_SITE_OTHER): Payer: Self-pay | Admitting: Pharmacist

## 2017-05-06 DIAGNOSIS — B20 Human immunodeficiency virus [HIV] disease: Secondary | ICD-10-CM
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
## 2017-05-09 LAB — HIV-1 RNA QUANT-NO REFLEX-BLD
HIV 1 RNA Quant: <20 copies/mL
HIV-1 RNA Quant, Log: <1.3 Log copies/mL

## 2017-07-08 ENCOUNTER — Ambulatory Visit: Payer: Medicaid Other | Admitting: Infectious Disease

## 2017-08-15 ENCOUNTER — Ambulatory Visit: Payer: Self-pay | Admitting: Infectious Disease

## 2017-09-25 ENCOUNTER — Other Ambulatory Visit: Payer: Self-pay | Admitting: *Deleted

## 2017-09-25 DIAGNOSIS — B20 Human immunodeficiency virus [HIV] disease: Secondary | ICD-10-CM

## 2017-09-25 MED ORDER — BICTEGRAVIR-EMTRICITAB-TENOFOV 50-200-25 MG PO TABS: 1.0000 | ORAL_TABLET | Freq: Every day | ORAL | 1 refills | Status: DC

## 2017-09-25 NOTE — Progress Notes (Signed)
Advancing access application

## 2017-10-01 ENCOUNTER — Encounter: Payer: Self-pay | Admitting: Infectious Disease

## 2017-10-07 ENCOUNTER — Ambulatory Visit: Payer: Self-pay | Admitting: Infectious Disease

## 2018-03-12 ENCOUNTER — Emergency Department (HOSPITAL_COMMUNITY)
Admission: EM | Admit: 2018-03-12 | Discharge: 2018-03-13 | Disposition: A | Discharge status: Home / Self Care | Payer: Self-pay | Attending: Emergency Medicine | Admitting: Emergency Medicine

## 2018-03-12 ENCOUNTER — Encounter (HOSPITAL_COMMUNITY): Payer: Self-pay

## 2018-03-12 ENCOUNTER — Emergency Department (HOSPITAL_COMMUNITY): Payer: Self-pay

## 2018-03-12 DIAGNOSIS — R4182 Altered mental status, unspecified: Secondary | ICD-10-CM | POA: Insufficient documentation

## 2018-03-12 DIAGNOSIS — R000 Tachycardia, unspecified: Secondary | ICD-10-CM

## 2018-03-12 DIAGNOSIS — R451 Restlessness and agitation: Secondary | ICD-10-CM

## 2018-03-12 DIAGNOSIS — F17210 Nicotine dependence, cigarettes, uncomplicated: Secondary | ICD-10-CM | POA: Insufficient documentation

## 2018-03-12 DIAGNOSIS — Z79899 Other long term (current) drug therapy: Secondary | ICD-10-CM | POA: Insufficient documentation

## 2018-03-12 DIAGNOSIS — F1410 Cocaine abuse, uncomplicated: Secondary | ICD-10-CM

## 2018-03-12 DIAGNOSIS — B20 Human immunodeficiency virus [HIV] disease: Secondary | ICD-10-CM | POA: Insufficient documentation

## 2018-03-12 LAB — CBC WITH DIFFERENTIAL/PLATELET
ABS IMMATURE GRANULOCYTES: 0.1 10*3/uL (ref 0.0–0.1)
BASOS ABS: 0.1 10*3/uL (ref 0.0–0.1)
Basophils Relative: 1 %
Eosinophils Absolute: 0 10*3/uL (ref 0.0–0.7)
Eosinophils Relative: 1 %
HCT: 44.5 % (ref 39.0–52.0)
HEMOGLOBIN: 14.4 g/dL (ref 13.0–17.0)
Immature Granulocytes: 1 %
LYMPHS PCT: 29 %
Lymphs Abs: 2.5 10*3/uL (ref 0.7–4.0)
MCH: 30.3 pg (ref 26.0–34.0)
MCHC: 32.4 g/dL (ref 30.0–36.0)
MCV: 93.5 fL (ref 78.0–100.0)
Monocytes Absolute: 0.4 10*3/uL (ref 0.1–1.0)
Monocytes Relative: 4 %
NEUTROS ABS: 5.7 10*3/uL (ref 1.7–7.7)
NEUTROS PCT: 64 %
Platelets: 334 10*3/uL (ref 150–400)
RBC: 4.76 MIL/uL (ref 4.22–5.81)
RDW: 12.6 % (ref 11.5–15.5)
WBC: 8.6 10*3/uL (ref 4.0–10.5)

## 2018-03-12 LAB — COMPREHENSIVE METABOLIC PANEL
ALBUMIN: 4.4 g/dL (ref 3.5–5.0)
ALK PHOS: 84 U/L (ref 38–126)
ALT: 26 U/L (ref 0–44)
AST: 55 U/L — ABNORMAL HIGH (ref 15–41)
Anion gap: 18 — ABNORMAL HIGH (ref 5–15)
BUN: 15 mg/dL (ref 6–20)
CALCIUM: 9.1 mg/dL (ref 8.9–10.3)
CHLORIDE: 104 mmol/L (ref 98–111)
CO2: 17 mmol/L — AB (ref 22–32)
Creatinine, Ser: 1.48 mg/dL — ABNORMAL HIGH (ref 0.61–1.24)
GFR calc Af Amer: >60 mL/min (ref >60)
GFR calc non Af Amer: >60 mL/min (ref >60)
GLUCOSE: 113 mg/dL — AB (ref 70–99)
Potassium: 3.2 mmol/L — ABNORMAL LOW (ref 3.5–5.1)
SODIUM: 139 mmol/L (ref 135–145)
TOTAL PROTEIN: 7.4 g/dL (ref 6.5–8.1)
Total Bilirubin: 0.9 mg/dL (ref 0.3–1.2)

## 2018-03-12 LAB — MAGNESIUM: Magnesium: 3.4 mg/dL — ABNORMAL HIGH (ref 1.7–2.4)

## 2018-03-12 LAB — CBG MONITORING, ED: Glucose-Capillary: 107 mg/dL — ABNORMAL HIGH (ref 70–99)

## 2018-03-12 LAB — ETHANOL

## 2018-03-12 LAB — LIPASE, BLOOD: LIPASE: 24 U/L (ref 11–51)

## 2018-03-12 MED ORDER — LORAZEPAM 2 MG/ML IJ SOLN
INTRAMUSCULAR | Status: AC
  Filled 2018-03-12: qty 1

## 2018-03-12 MED ORDER — SODIUM CHLORIDE 0.9 % IV BOLUS
1000.0000 mL | Freq: Once | INTRAVENOUS | Status: AC
  Administered 2018-03-12: 1000 mL via INTRAVENOUS

## 2018-03-12 MED ORDER — LORAZEPAM 2 MG/ML IJ SOLN
1.0000 mg | Freq: Once | INTRAMUSCULAR | Status: AC
  Administered 2018-03-12: 1 mg via INTRAVENOUS
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
## 2018-03-12 NOTE — ED Triage Notes (Signed)
Pt BIB [facility] for eval of tachycardia and agitation s/p crack use. Pt reports he used crack cocaine in 1 hr PTA. EMS reports he was extremely agitated during transport, requiring 5mg  IM Versed in transport. Arrives w/ [employer]. Pt is calm and cooperative on arrival, profusely diaphoretic

## 2018-03-13 LAB — URINALYSIS, ROUTINE W REFLEX MICROSCOPIC
Bilirubin Urine: NEGATIVE
Glucose, UA: NEGATIVE mg/dL
Ketones, ur: NEGATIVE mg/dL
LEUKOCYTES UA: NEGATIVE
Nitrite: NEGATIVE
PH: 5 (ref 5.0–8.0)
Protein, ur: 100 mg/dL — AB
Specific Gravity, Urine: 1.016 (ref 1.005–1.030)

## 2018-03-13 LAB — RAPID URINE DRUG SCREEN, HOSP PERFORMED
AMPHETAMINES: NOT DETECTED
Benzodiazepines: POSITIVE — AB
COCAINE: POSITIVE — AB
Opiates: NOT DETECTED
TETRAHYDROCANNABINOL: POSITIVE — AB

## 2018-03-13 NOTE — ED Notes (Signed)
Pt alert, no family present VS documented

## 2018-03-13 NOTE — Discharge Instructions (Addendum)
Follow-up with your primary doctor as needed, and return to the ER if you experience any new and/or concerning symptoms.
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
## 2018-03-17 ENCOUNTER — Other Ambulatory Visit: Payer: Self-pay | Admitting: Infectious Disease

## 2018-03-17 ENCOUNTER — Telehealth: Payer: Self-pay

## 2018-03-17 DIAGNOSIS — B20 Human immunodeficiency virus [HIV] disease: Secondary | ICD-10-CM

## 2018-03-17 NOTE — Telephone Encounter (Signed)
Attempted to call pt to schedule an appt with Dr. Daiva EvesVan Dam. Pt has not been seen in clinic since June 2018 was supposed to have a f/u with pharmacy one month later after starting Biktarvy.  Called number listed in pt's chart was answered by pt father who stated pt no longer lives in his household and would be glad to give an alternative number. Call was disconnected before alternative number was given.  Jerry Finley, [place]

## 2018-04-01 ENCOUNTER — Other Ambulatory Visit: Payer: Self-pay

## 2018-04-01 ENCOUNTER — Emergency Department (HOSPITAL_COMMUNITY)
Admission: EM | Admit: 2018-04-01 | Discharge: 2018-04-02 | Disposition: A | Discharge status: Home / Self Care | Payer: Self-pay | Attending: Emergency Medicine | Admitting: Emergency Medicine

## 2018-04-01 ENCOUNTER — Encounter (HOSPITAL_COMMUNITY): Payer: Self-pay | Admitting: *Deleted

## 2018-04-01 DIAGNOSIS — F17210 Nicotine dependence, cigarettes, uncomplicated: Secondary | ICD-10-CM | POA: Insufficient documentation

## 2018-04-01 DIAGNOSIS — R45851 Suicidal ideations: Secondary | ICD-10-CM | POA: Insufficient documentation

## 2018-04-01 DIAGNOSIS — F1414 Cocaine abuse with cocaine-induced mood disorder: Secondary | ICD-10-CM | POA: Insufficient documentation

## 2018-04-01 DIAGNOSIS — F329 Major depressive disorder, single episode, unspecified: Secondary | ICD-10-CM | POA: Insufficient documentation

## 2018-04-01 DIAGNOSIS — Z008 Encounter for other general examination: Secondary | ICD-10-CM

## 2018-04-01 DIAGNOSIS — Z79899 Other long term (current) drug therapy: Secondary | ICD-10-CM | POA: Insufficient documentation

## 2018-04-01 DIAGNOSIS — B20 Human immunodeficiency virus [HIV] disease: Secondary | ICD-10-CM | POA: Insufficient documentation

## 2018-04-01 NOTE — ED Notes (Signed)
Bed: [facility]4 Expected date:  Expected time:  Means of arrival:  Comments: 

## 2018-04-01 NOTE — ED Notes (Signed)
Patient was wanded my security and taken to TCU.  Patient has one bag with a green tag on it

## 2018-04-01 NOTE — ED Triage Notes (Signed)
Pt arrives via [employer] under IVC. Papers states pt has hx of depression and SI, has not been sleeping, eating or showering, and has been abusing drugs. Pt denies SI, reports last cocaine use yesterday.  Pt has been wanded, clothing changed, belongings removed. [facility]

## 2018-04-02 DIAGNOSIS — F1414 Cocaine abuse with cocaine-induced mood disorder: Secondary | ICD-10-CM | POA: Diagnosis present

## 2018-04-02 LAB — COMPREHENSIVE METABOLIC PANEL
ALBUMIN: 3.5 g/dL (ref 3.5–5.0)
ALT: 18 U/L (ref 0–44)
AST: 25 U/L (ref 15–41)
Alkaline Phosphatase: 97 U/L (ref 38–126)
Anion gap: 11 (ref 5–15)
BUN: 11 mg/dL (ref 6–20)
CHLORIDE: 108 mmol/L (ref 98–111)
CO2: 24 mmol/L (ref 22–32)
Calcium: 8.9 mg/dL (ref 8.9–10.3)
Creatinine, Ser: 0.87 mg/dL (ref 0.61–1.24)
GFR calc Af Amer: >60 mL/min (ref >60)
GLUCOSE: 94 mg/dL (ref 70–99)
POTASSIUM: 3.5 mmol/L (ref 3.5–5.1)
Sodium: 143 mmol/L (ref 135–145)
Total Bilirubin: 0.5 mg/dL (ref 0.3–1.2)
Total Protein: 6.6 g/dL (ref 6.5–8.1)

## 2018-04-02 LAB — ACETAMINOPHEN LEVEL

## 2018-04-02 LAB — RAPID URINE DRUG SCREEN, HOSP PERFORMED
Amphetamines: NOT DETECTED
Barbiturates: NOT DETECTED
Benzodiazepines: NOT DETECTED
COCAINE: POSITIVE — AB
Opiates: NOT DETECTED
Tetrahydrocannabinol: POSITIVE — AB

## 2018-04-02 LAB — CBC
HCT: 40.5 % (ref 39.0–52.0)
HEMOGLOBIN: 13.9 g/dL (ref 13.0–17.0)
MCH: 31.2 pg (ref 26.0–34.0)
MCHC: 34.3 g/dL (ref 30.0–36.0)
MCV: 90.8 fL (ref 78.0–100.0)
Platelets: 302 10*3/uL (ref 150–400)
RBC: 4.46 MIL/uL (ref 4.22–5.81)
RDW: 13.4 % (ref 11.5–15.5)
WBC: 7.8 10*3/uL (ref 4.0–10.5)

## 2018-04-02 LAB — SALICYLATE LEVEL: Salicylate Lvl: <7 mg/dL (ref 2.8–30.0)

## 2018-04-02 LAB — ETHANOL

## 2018-04-02 MED ORDER — BICTEGRAVIR-EMTRICITAB-TENOFOV 50-200-25 MG PO TABS
1.0000 | ORAL_TABLET | Freq: Every day | ORAL | Status: DC
  Administered 2018-04-02: 1 via ORAL
  Filled 2018-04-02: qty 1

## 2018-04-02 NOTE — ED Provider Notes (Signed)
[facility]-EMERGENCY DEPT Provider Note   CSN: [id number] Arrival date & time: 04/01/18  2319     History   Chief Complaint Chief Complaint  Patient presents with  . Medical Clearance    HPI Jerry Finley is a 22 y.o. male.  HPI 22 year old Caucasian male past medical history significant for polysubstance abuse and HIV presents to the emergency department today after being IVC by [employer].  Patient has history of depression and suicidal ideations.  Patient has not been sleeping, eating or showering and has been abusing drugs per family.  Patient denies any SI or HI.  States that his ex-wife her father called the police today.  Patient reports last cocaine use was yesterday.  Patient denies any homicidal ideations.  Denies any auditory visual hallucinations.  Reports alcohol use daily.  Denies any other drug use.  Does report tobacco use.  Denies any prior history of suicide attempt.  Patient states that he is not sure why he is here.  Denies any medical complaints or pain at this time. Past Medical History:  Diagnosis Date  . Concussion with loss of consciousness 06/25/2016  . 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 HIV (human immunodeficiency virus infection) (HCC)   . Legal problem 04/09/2016  . Nausea with vomiting 06/25/2016  . Smoker 11/20/2016    Patient Active Problem List   Diagnosis Date Noted  . Smoker 11/20/2016  . Concussion with loss of consciousness 06/25/2016  . Nausea with vomiting 06/25/2016  . 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 Legal problem 04/09/2016  . HIV disease (HCC) 03/20/2016    Past Surgical History:  Procedure Laterality Date  . HAND SURGERY          Home Medications    Prior to Admission medications   Medication Sig Start Date End Date Taking? Authorizing Provider  bictegravir-emtricitabine-tenofovir AF (BIKTARVY) 50-200-25 MG TABS tablet Take 1 tablet by mouth daily. 09/25/17  Yes  Cliffton Asters, MD  methadone (DOLOPHINE) 1 MG/1ML solution Take 120 mg by mouth daily.   Yes [provider]    Family History No family history on file.  Social History Social History   Tobacco Use  . Smoking status: Current Every Day Smoker    Packs/day: 0.50    Types: Cigarettes  . Smokeless tobacco: Never Used  Substance Use Topics  . Alcohol use: Yes  . Drug use: Yes    Types: Cocaine     Allergies   Patient has no known allergies.   Review of Systems Review of Systems  All other systems reviewed and are negative.    Physical Exam Updated Vital Signs BP 132/76 (BP Location: Right Arm)   Pulse (!) 42   Temp 98.5 F (36.9 C) (Oral)   Resp 17   SpO2 99%   Physical Exam  Constitutional: He appears well-developed and well-nourished. No distress.  HENT:  Head: Normocephalic and atraumatic.  Eyes: Right eye exhibits no discharge. Left eye exhibits no discharge. No scleral icterus.  Neck: Normal range of motion.  Pulmonary/Chest: No respiratory distress.  Musculoskeletal: Normal range of motion.  Neurological: He is alert.  Skin: No pallor.  Psychiatric: His behavior is normal. Judgment and thought content normal.  Nursing note and vitals reviewed.    ED Treatments / Results  Labs (all labs ordered are listed, but only abnormal results are displayed) Labs Reviewed  ACETAMINOPHEN LEVEL - Abnormal; Notable for the following components:  Result Value   Acetaminophen (Tylenol), Serum <10 (*)    All other components within normal limits  RAPID URINE DRUG SCREEN, HOSP PERFORMED - Abnormal; Notable for the following components:   Cocaine POSITIVE (*)    Tetrahydrocannabinol POSITIVE (*)    All other components within normal limits  COMPREHENSIVE METABOLIC PANEL  ETHANOL  SALICYLATE LEVEL  CBC    EKG None  Radiology No results found.  Procedures Procedures (including critical care time)  Medications Ordered in ED Medications - No  data to display   Initial Impression / Assessment and Plan / ED Course  I have reviewed the triage vital signs and the nursing notes.  Pertinent labs & imaging results that were available during my care of the patient were reviewed by me and considered in my medical decision making (see chart for details).     Presents to the ED after being IVC by family.  Patient has no complaints at this time.  Denies homicidal or suicidal ideations.  Patient resting calmly in the bed.  Vital signs reassuring.  Medical screening lab work reassuring except for positive UDS.  Patient can be medically for TTS evaluation and disposition.  Final Clinical Impressions(s) / ED Diagnoses   Final diagnoses:  Medical clearance for psychiatric admission    ED Discharge Orders    None       Wallace KellerLeaphart, Kenneth T, PA-C 04/02/18 0144    Geoffery Lyonselo, Douglas, MD 04/02/18 0600

## 2018-04-02 NOTE — ED Notes (Signed)
Pt belongings placed in locker 29, orange T-shirt, black jeans and black boots noted.

## 2018-04-02 NOTE — BH Assessment (Signed)
Assessment Note  Jerry Finley is an 22 y.o. male IVC'd and brought to [facility] by [employer] for depression and suicidal.  Pt states "I have no idea why I'm here. My fiancee moved out the house and broke up with me. She took my son and wouldn't let me see him so we got into an arguement.  I guess my dad thought I was going to hurt myself".  According to IVC Respondent has history of depression and suicidal..."  Pt denies SI/HI/A/V-hallucination.  Pt admits to ongoing poly-substance use (Alcohol, Cocaine, and Heroin) last use was PTA.  Pt reports the longest period of sobriety was "1 yr while incarcerated."  Pt reports receiving treatment at [facility].  Pt currently lives alone.  Pt reports his live-in girlfriend since 8th grade moved out today with their 2 yr old son.  Pt report quitting school his senior year.  Pt denies a history of physical, verbal, or sexual abuse.  Pt reports a history self harm in the form of " cutting in middle school."  Pt reports being treated inpatient during middle school couldn't remember where or when.  Pt is currently being treated at [facility].   Patient was wearing scrubs and appeared dissheveled.  Pt was alert throughout the assessment.  Patient made fair eye contact and had normal psychomotor activity.  Patient spoke in a normal voice without pressured speech.  Pt expressed feeling fine.  Pt's affect appeared euthymic and congruent with stated mood. Pt's thought process was logical and coherent.  Pt presented with partial insight and judgement.  Pt did not appear to be responding to internal stimuli.  Pt was able to contract for safety.  Disposition: Case discussed with Nira Conn, NP who recommends overnight observation for safety and stability and re-evaluation in the AM by psychiatry.  LPC informed Dr Judd Lien and pt's nurse of the recommended disposition.  Diagnosis: Major Depressive Disorder; Severe; Opioid Use Disorder; Severe   Past Medical History:  Past Medical  History:  Diagnosis Date  . Concussion with loss of consciousness 06/25/2016  . 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 HIV (human immunodeficiency virus infection) (HCC)   . Legal problem 04/09/2016  . Nausea with vomiting 06/25/2016  . Smoker 11/20/2016    Past Surgical History:  Procedure Laterality Date  . HAND SURGERY      Family History: No family history on file.  Social History:  reports that he has been smoking cigarettes.  He has been smoking about 0.50 packs per day. He has never used smokeless tobacco. He reports that he drinks alcohol. He reports that he has current or past drug history. Drug: Cocaine.  Additional Social History:  Alcohol / Drug Use Pain Medications: See MARs Prescriptions: See MARs Over the Counter: See MARs History of alcohol / drug use?: Yes Longest period of sobriety (when/how long): "1 year while incarcerated"  Negative Consequences of Use: Personal relationships, Legal Substance #1 Name of Substance 1: Alcohol 1 - Age of First Use: unknown 1 - Amount (size/oz): unknown 1 - Frequency: unknown 1 - Duration: ongoing 1 - Last Use / Amount: PTA Substance #2 Name of Substance 2: Cocaine 2 - Age of First Use: unknown 2 - Amount (size/oz): unknown 2 - Frequency: unknown 2 - Duration: ongoing 2 - Last Use / Amount: PTA Substance #3 Name of Substance 3: Heroin 3 - Age of First Use: Unknown 3 - Amount (size/oz): Unknown 3 - Frequency: Unknown 3 - Duration: ongoing 3 -  Last Use / Amount: PTA  CIWA: CIWA-Ar BP: 132/76 Pulse Rate: (!) 42 COWS:    Allergies: No Known Allergies  Home Medications:  (Not in a hospital admission)  OB/GYN Status:  No LMP for male patient.  General Assessment Data [facility] TTS Assessment: In system Is this a Tele or Face-to-Face Assessment?: Face-to-Face Is this an Initial Assessment or a Re-assessment for this encounter?: Initial Assessment Marital status: Long term  relationship(Pt reports being in a relationship since 8th grade) Maiden name: NA Is patient pregnant?: No Pregnancy Status: No Living Arrangements: Alone(Pt reports his girlfriend and child moved out PTA) Can pt return to current living arrangement?: Yes Admission Status: Involuntary Is patient capable of signing voluntary admission?: No Referral Source: Self/Family/Friend Insurance type: NONE     Crisis Care Plan Living Arrangements: Alone(Pt reports his girlfriend and child moved out PTA) Legal Guardian: Other:(Self) Name of Psychiatrist:    Education Status Is patient currently in school?: No Is the patient employed, unemployed or receiving disability?: Unemployed  Risk to self with the past 6 months Suicidal Ideation: No(pt denies) Has patient been a risk to self within the past 6 months prior to admission? : No Suicidal Intent: No Has patient had any suicidal intent within the past 6 months prior to admission? : No Is patient at risk for suicide?: No Suicidal Plan?: No Has patient had any suicidal plan within the past 6 months prior to admission? : No Access to Means: No What has been your use of drugs/alcohol within the last 12 months?: Cocaine, Heroin, Alcohol Previous Attempts/Gestures: Yes How many times?: 1(middle school pt states he was a cutter) Other Self Harm Risks: pt reports cutting in middle school Triggers for Past Attempts: Unknown Intentional Self Injurious Behavior: Cutting Comment - Self Injurious Behavior: pt reports cutting in middle school Family Suicide History: Unknown Recent stressful life event(s): Conflict (Comment), Divorce, Loss (Comment)(recent breakup) Persecutory voices/beliefs?: No Depression: Yes Depression Symptoms: Insomnia, Tearfulness, Fatigue, Guilt, Feeling worthless/self pity Substance abuse history and/or treatment for substance abuse?: Yes Suicide prevention information given to non-admitted patients: Not applicable  Risk to  Others within the past 6 months Homicidal Ideation: No Does patient have any lifetime risk of violence toward others beyond the six months prior to admission? : No Thoughts of Harm to Others: No Current Homicidal Intent: No Current Homicidal Plan: No Access to Homicidal Means: No History of harm to others?: No Assessment of Violence: None Noted Does patient have access to weapons?: No Criminal Charges Pending?: Yes Describe Pending Criminal Charges: pt doesn't know Does patient have a court date: (unknown) Is patient on probation?: No(Pt denies being on probation)  Psychosis Hallucinations: None noted Delusions: None noted  Mental Status Report Appearance/Hygiene: Disheveled, In scrubs Eye Contact: Poor Motor Activity: Freedom of movement Speech: Logical/coherent Level of Consciousness: Quiet/awake Mood: Depressed Affect: Appropriate to circumstance Anxiety Level: None Thought Processes: Coherent Judgement: Partial Orientation: Person, Place, Time, Appropriate for developmental age Obsessive Compulsive Thoughts/Behaviors: None  Cognitive Functioning Concentration: Normal Memory: Recent Intact, Remote Intact Is patient IDD: No Is patient DD?: No Insight: Fair Impulse Control: Poor Appetite: Fair Have you had any weight changes? : No Change Sleep: Increased Total Hours of Sleep: 8 Vegetative Symptoms: Staying in bed, Not bathing  ADLScreening [facility] Assessment Services) Patient's cognitive ability adequate to safely complete daily activities?: Yes Patient able to express need for assistance with ADLs?: Yes Independently performs ADLs?: Yes (appropriate for developmental age)  Prior Inpatient Therapy  Prior Inpatient Therapy: Yes Prior Therapy Dates: unknown(pt states "when I was in middle school") Prior Therapy Facilty/Provider(s): unknown Reason for Treatment: Cutting  Prior Outpatient Therapy Prior Outpatient Therapy: No Does patient have an ACCT team?:  No Does patient have Intensive In-House Services?  : No Does patient have [facility] services? : No Does patient have P4CC services?: No  ADL Screening (condition at time of admission) Patient's cognitive ability adequate to safely complete daily activities?: Yes Is the patient deaf or have difficulty hearing?: No Does the patient have difficulty seeing, even when wearing glasses/contacts?: No Does the patient have difficulty concentrating, remembering, or making decisions?: No Patient able to express need for assistance with ADLs?: Yes Does the patient have difficulty dressing or bathing?: No Independently performs ADLs?: Yes (appropriate for developmental age) Does the patient have difficulty walking or climbing stairs?: No Weakness of Legs: None Weakness of Arms/Hands: None  Home Assistive Devices/Equipment Home Assistive Devices/Equipment: None    Abuse/Neglect Assessment (Assessment to be complete while patient is alone) Abuse/Neglect Assessment Can Be Completed: Yes Physical Abuse: Denies Verbal Abuse: Denies Sexual Abuse: Denies Exploitation of patient/patient's resources: Denies Self-Neglect: Denies Values / Beliefs Cultural Requests During Hospitalization: None Spiritual Requests During Hospitalization: None Consults Spiritual Care Consult Needed: No Social Work Consult Needed: No Merchant navy officer (For Healthcare) Does Patient Have a Medical Advance Directive?: No Would patient like information on creating a medical advance directive?: No - Patient declined          Disposition: Case discussed with Nira Conn, NP who recommends overnight observation for safety and stability and re-evaluation in the AM by psychiatry.  LPC informed Dr Judd Lien and pt's nurse of the recommended disposition.  Disposition Initial Assessment Completed for this Encounter: Yes  On Site Evaluation by:   Reviewed with Physician:    Noma Quijas L Yuna Pizzolato 04/02/2018 3:12 AM

## 2018-04-02 NOTE — ED Notes (Signed)
Bed: [facility] Expected date:  Expected time:  Means of arrival:  Comments: 

## 2018-04-02 NOTE — BHH Suicide Risk Assessment (Signed)
Suicide Risk Assessment  Discharge Assessment   [facility]   Principal Problem: Cocaine abuse with cocaine-induced mood disorder [facility](HCC) Discharge Diagnoses:  Patient Active Problem List   Diagnosis Date Noted  . Cocaine abuse with cocaine-induced mood disorder [facility](HCC) [F14.14] 04/02/2018    Priority: High  . Smoker [F17.200] 11/20/2016  . Concussion with loss of consciousness [S06.0X9A] 06/25/2016  . Nausea with vomiting [R11.2] 06/25/2016  . H/O intravenous drug use in remission [Z87.898] 04/09/2016  . Legal problem [Z65.3] 04/09/2016  . HIV disease (HCC) [B20] 03/20/2016    Total Time spent with patient: 45 minutes  Musculoskeletal: Strength & Muscle Tone: within normal limits Gait & Station: normal Patient leans: N/A  Psychiatric Specialty Exam:   Blood pressure 121/80, pulse (!) 46, temperature 98.1 F (36.7 C), temperature source Oral, resp. rate 16, SpO2 99 %.There is no height or weight on file to calculate BMI.  General Appearance: Disheveled  Eye Contact::  Good  Speech:  Normal Rate409  Volume:  Normal  Mood:  Euthymic  Affect:  Congruent  Thought Process:  Coherent and Descriptions of Associations: Intact  Orientation:  Full (Time, Place, and Person)  Thought Content:  WDL and Logical  Suicidal Thoughts:  No  Homicidal Thoughts:  No  Memory:  Immediate;   Good Recent;   Good Remote;   Good  Judgement:  Fair  Insight:  Good  Psychomotor Activity:  Normal  Concentration:  Good  Recall:  Good  Fund of Knowledge:Good  Language: Good  Akathisia:  No  Handed:  Right  AIMS (if indicated):     Assets:  Housing Leisure Time Physical Health Resilience Social Support  Sleep:     Cognition: WNL  ADL's:  Intact   Mental Status Per Nursing Assessment::   On Admission:   22 yo male who presented to the ED under IVC because his family was worried after his girlfriend left him.  He was using some cocaine yesterday but denies suicidal  ideations and past attempts except one from his middle school days.  Today, he is clear and coherent with no suicidal/homicidal ideations, hallucinations, or withdrawal symptoms.  Reports he takes [facility]one [facility]  Stable for discharge.  Demographic Factors:  Male, Adolescent or young adult and Caucasian  Loss Factors: Loss of significant relationship  Historical Factors: NA  Risk Reduction Factors:   Sense of responsibility to family, Positive social support and Positive therapeutic relationship  Continued Clinical Symptoms:  None   Cognitive Features That Contribute To Risk:  None    Suicide Risk:  Minimal: No identifiable suicidal ideation.  Patients presenting with no risk factors but with morbid ruminations; may be classified as minimal risk based on the severity of the depressive symptoms    Plan Of Care/Follow-up recommendations:  Activity:  as tolerated Diet:  heart healthy diet  LORD, JAMISON, NP 04/02/2018, 10:33 AM

## 2018-04-02 NOTE — Discharge Instructions (Signed)
For your behavioral health needs, you are advised to continue treatment with your current providers:       [facility]      [address]      [place], [place] [address]      [phone]) [phone]       [facility]      [address]      [place], [place] [address]      [phone]) [phone]

## 2018-04-02 NOTE — ED Notes (Signed)
Pt discharged safely with discharge instructions reviewed.  Patient is in no distress and is calm and cooperative.  All belongings were returned to patient.

## 2018-04-02 NOTE — BH Assessment (Signed)
[facility]  Per Juanetta BeetsJacqueline Norman, DO, this pt does not require psychiatric hospitalization at this time.  Pt presents under IVC initiated by pt's girlfriend, which Dr Sharma CovertNorman has rescinded.  [facility].  These have been included in pt's discharge instructions.  Pt's nurse, Kendal Hymendie, has been notified.  Doylene Canninghomas Everrett Lacasse, MA Triage Specialist [phone]

## 2018-04-02 NOTE — ED Notes (Signed)
Pt alert x4 watching t.v. is cooperative, state he is not sure why he is here, he feels like his family may have called 911 because he has been doing street drugs lately. I will continue to monitor.

## 2018-04-05 ENCOUNTER — Other Ambulatory Visit: Payer: Self-pay | Admitting: Infectious Disease

## 2018-04-05 DIAGNOSIS — B20 Human immunodeficiency virus [HIV] disease: Secondary | ICD-10-CM

## 2018-04-21 ENCOUNTER — Other Ambulatory Visit: Payer: Self-pay

## 2018-04-21 DIAGNOSIS — B20 Human immunodeficiency virus [HIV] disease: Secondary | ICD-10-CM

## 2018-04-22 ENCOUNTER — Other Ambulatory Visit: Payer: Self-pay

## 2018-04-22 ENCOUNTER — Encounter (HOSPITAL_COMMUNITY): Payer: Self-pay | Admitting: Emergency Medicine

## 2018-04-22 ENCOUNTER — Emergency Department (HOSPITAL_COMMUNITY)
Admission: EM | Admit: 2018-04-22 | Discharge: 2018-04-22 | Disposition: A | Discharge status: Home / Self Care | Payer: Self-pay | Attending: Emergency Medicine | Admitting: Emergency Medicine

## 2018-04-22 ENCOUNTER — Emergency Department (HOSPITAL_COMMUNITY): Payer: Self-pay

## 2018-04-22 DIAGNOSIS — T07XXXA Unspecified multiple injuries, initial encounter: Secondary | ICD-10-CM

## 2018-04-22 DIAGNOSIS — F1910 Other psychoactive substance abuse, uncomplicated: Secondary | ICD-10-CM

## 2018-04-22 DIAGNOSIS — F19920 Other psychoactive substance use, unspecified with intoxication, uncomplicated: Secondary | ICD-10-CM

## 2018-04-22 DIAGNOSIS — Z79899 Other long term (current) drug therapy: Secondary | ICD-10-CM | POA: Insufficient documentation

## 2018-04-22 DIAGNOSIS — F17210 Nicotine dependence, cigarettes, uncomplicated: Secondary | ICD-10-CM | POA: Insufficient documentation

## 2018-04-22 DIAGNOSIS — Z21 Asymptomatic human immunodeficiency virus [HIV] infection status: Secondary | ICD-10-CM | POA: Insufficient documentation

## 2018-04-22 LAB — COMPREHENSIVE METABOLIC PANEL
ALT: 32 U/L (ref 0–44)
ANION GAP: 21 — AB (ref 5–15)
AST: 70 U/L — ABNORMAL HIGH (ref 15–41)
Albumin: 5.2 g/dL — ABNORMAL HIGH (ref 3.5–5.0)
Alkaline Phosphatase: 135 U/L — ABNORMAL HIGH (ref 38–126)
BUN: 20 mg/dL (ref 6–20)
CALCIUM: 9.7 mg/dL (ref 8.9–10.3)
CO2: 17 mmol/L — ABNORMAL LOW (ref 22–32)
CREATININE: 1.21 mg/dL (ref 0.61–1.24)
Chloride: 101 mmol/L (ref 98–111)
Glucose, Bld: 116 mg/dL — ABNORMAL HIGH (ref 70–99)
Potassium: 4.8 mmol/L (ref 3.5–5.1)
Sodium: 139 mmol/L (ref 135–145)
TOTAL PROTEIN: 9 g/dL — AB (ref 6.5–8.1)
Total Bilirubin: 0.6 mg/dL (ref 0.3–1.2)

## 2018-04-22 LAB — CBC
HCT: 45.9 % (ref 39.0–52.0)
Hemoglobin: 15.5 g/dL (ref 13.0–17.0)
MCH: 31.2 pg (ref 26.0–34.0)
MCHC: 33.8 g/dL (ref 30.0–36.0)
MCV: 92.4 fL (ref 78.0–100.0)
PLATELETS: 363 10*3/uL (ref 150–400)
RBC: 4.97 MIL/uL (ref 4.22–5.81)
RDW: 13.4 % (ref 11.5–15.5)
WBC: 12.2 10*3/uL — AB (ref 4.0–10.5)

## 2018-04-22 LAB — RAPID URINE DRUG SCREEN, HOSP PERFORMED
Amphetamines: POSITIVE — AB
BENZODIAZEPINES: POSITIVE — AB
Barbiturates: NEGATIVE — AB
COCAINE: POSITIVE — AB
Tetrahydrocannabinol: POSITIVE — AB

## 2018-04-22 LAB — ETHANOL

## 2018-04-22 MED ORDER — SODIUM CHLORIDE 0.9 % IV BOLUS
1000.0000 mL | Freq: Once | INTRAVENOUS | Status: AC
  Administered 2018-04-22: 1000 mL via INTRAVENOUS

## 2018-04-22 MED ORDER — ONDANSETRON HCL 4 MG/2ML IJ SOLN: 4.0000 mg | Freq: Once | INTRAMUSCULAR | Status: DC
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
## 2018-04-23 LAB — HIV-1 RNA QUANT-NO REFLEX-BLD
HIV 1 RNA Quant: <20 copies/mL
HIV-1 RNA Quant, Log: <1.3 Log copies/mL

## 2018-04-28 ENCOUNTER — Other Ambulatory Visit: Payer: Self-pay

## 2018-04-30 ENCOUNTER — Emergency Department (HOSPITAL_COMMUNITY)
Admission: EM | Admit: 2018-04-30 | Discharge: 2018-04-30 | Disposition: A | Discharge status: Home / Self Care | Payer: Self-pay | Attending: Emergency Medicine | Admitting: Emergency Medicine

## 2018-04-30 ENCOUNTER — Encounter (HOSPITAL_COMMUNITY): Payer: Self-pay | Admitting: *Deleted

## 2018-04-30 ENCOUNTER — Emergency Department (HOSPITAL_COMMUNITY): Payer: Self-pay

## 2018-04-30 DIAGNOSIS — Y999 Unspecified external cause status: Secondary | ICD-10-CM | POA: Insufficient documentation

## 2018-04-30 DIAGNOSIS — Z79899 Other long term (current) drug therapy: Secondary | ICD-10-CM | POA: Insufficient documentation

## 2018-04-30 DIAGNOSIS — S0990XA Unspecified injury of head, initial encounter: Secondary | ICD-10-CM | POA: Insufficient documentation

## 2018-04-30 DIAGNOSIS — Y939 Activity, unspecified: Secondary | ICD-10-CM | POA: Insufficient documentation

## 2018-04-30 DIAGNOSIS — S0001XA Abrasion of scalp, initial encounter: Secondary | ICD-10-CM

## 2018-04-30 DIAGNOSIS — F17210 Nicotine dependence, cigarettes, uncomplicated: Secondary | ICD-10-CM | POA: Insufficient documentation

## 2018-04-30 DIAGNOSIS — Y929 Unspecified place or not applicable: Secondary | ICD-10-CM | POA: Insufficient documentation

## 2018-04-30 LAB — ETHANOL: Alcohol, Ethyl (B): <10 mg/dL (ref <10)

## 2018-04-30 LAB — RAPID URINE DRUG SCREEN, HOSP PERFORMED
AMPHETAMINES: POSITIVE — AB
BARBITURATES: NOT DETECTED
Benzodiazepines: NOT DETECTED
Cocaine: POSITIVE — AB
OPIATES: NOT DETECTED
TETRAHYDROCANNABINOL: POSITIVE — AB

## 2018-04-30 LAB — COMPREHENSIVE METABOLIC PANEL
ALT: 80 U/L — ABNORMAL HIGH (ref 0–44)
AST: 77 U/L — AB (ref 15–41)
Albumin: 3.9 g/dL (ref 3.5–5.0)
Alkaline Phosphatase: 88 U/L (ref 38–126)
Anion gap: 9 (ref 5–15)
BUN: 19 mg/dL (ref 6–20)
CHLORIDE: 107 mmol/L (ref 98–111)
CO2: 26 mmol/L (ref 22–32)
Calcium: 8.9 mg/dL (ref 8.9–10.3)
Creatinine, Ser: 0.73 mg/dL (ref 0.61–1.24)
Glucose, Bld: 81 mg/dL (ref 70–99)
POTASSIUM: 4.2 mmol/L (ref 3.5–5.1)
SODIUM: 142 mmol/L (ref 135–145)
Total Bilirubin: 0.7 mg/dL (ref 0.3–1.2)
Total Protein: 7 g/dL (ref 6.5–8.1)

## 2018-04-30 LAB — CBC
HCT: 38.8 % — ABNORMAL LOW (ref 39.0–52.0)
Hemoglobin: 12.6 g/dL — ABNORMAL LOW (ref 13.0–17.0)
MCH: 30.4 pg (ref 26.0–34.0)
MCHC: 32.5 g/dL (ref 30.0–36.0)
MCV: 93.5 fL (ref 78.0–100.0)
PLATELETS: 280 10*3/uL (ref 150–400)
RBC: 4.15 MIL/uL — ABNORMAL LOW (ref 4.22–5.81)
RDW: 13 % (ref 11.5–15.5)
WBC: 10.6 10*3/uL — AB (ref 4.0–10.5)

## 2018-04-30 MED ORDER — ONDANSETRON HCL 4 MG/2ML IJ SOLN
4.0000 mg | Freq: Once | INTRAMUSCULAR | Status: AC
  Administered 2018-04-30: 4 mg via INTRAVENOUS
  Filled 2018-04-30: qty 2

## 2018-04-30 MED ORDER — SODIUM CHLORIDE 0.9 % IV BOLUS
1000.0000 mL | Freq: Once | INTRAVENOUS | Status: AC
  Administered 2018-04-30: 1000 mL via INTRAVENOUS

## 2018-04-30 NOTE — Discharge Instructions (Signed)
You were examined today for a head injury and possible concussion.  Your head CT showed no evidence of  Injury today.   Sometimes serious problems can develop after a head injury. Please return to the emergency department if you experience any of the following symptoms: Repeated vomiting Headache that gets worse and does not go away Loss of consciousness or inability to stay awake at times when you   normally would be able to Getting more confused, restless or agitated Convulsions or seizures Difficulty walking or feeling off balance Weakness or numbness Vision changes A concussion is a very mild traumatic brain injury caused by a bump, jolt or blow to the head, most people recover quickly and fully. You can experience a wide variety of symptoms including:   - Confusion      - Difficulty concentrating       - Trouble remembering new info  - Headache      - Dizziness        - Fuzzy or blurry vision  - Fatigue      - Balance problems      - Light sensitivity  - Mood swings     - Changes in sleep or difficulty sleeping   To help these symptoms improve make sure you are getting plenty of rest, avoid screen time, loud music and strenuous mental activities. Avoid any strenuous physical activities, once your symptoms have resolved a slow and gradual return to activity is recommended. It is very important that you avoid situations in which you might sustain a second head injury as this can be very dangerous and life threatening. You cannot be medically cleared to return to normal activities until you have followed up with your primary doctor or a concussion specialist for reevaluation.  

## 2018-04-30 NOTE — ED Notes (Signed)
Pt is too lethargic for PO Fluid challenge
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
## 2018-05-06 ENCOUNTER — Encounter: Payer: Self-pay | Admitting: Family

## 2018-05-10 ENCOUNTER — Other Ambulatory Visit: Payer: Self-pay | Admitting: Infectious Disease

## 2018-05-10 DIAGNOSIS — B20 Human immunodeficiency virus [HIV] disease: Secondary | ICD-10-CM

## 2018-05-19 ENCOUNTER — Encounter: Payer: Self-pay | Admitting: Family
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
## 2018-06-09 ENCOUNTER — Ambulatory Visit (INDEPENDENT_AMBULATORY_CARE_PROVIDER_SITE_OTHER): Payer: Medicaid Other | Admitting: Family

## 2018-06-09 ENCOUNTER — Other Ambulatory Visit: Payer: Self-pay | Admitting: Behavioral Health

## 2018-06-09 ENCOUNTER — Encounter: Payer: Self-pay | Admitting: Family

## 2018-06-09 VITALS — BP 130/78 | HR 90 | Temp 99.9°F | Resp 16 | Ht 67.0 in | Wt 126.0 lb

## 2018-06-09 DIAGNOSIS — R748 Abnormal levels of other serum enzymes: Secondary | ICD-10-CM

## 2018-06-09 DIAGNOSIS — B20 Human immunodeficiency virus [HIV] disease: Secondary | ICD-10-CM

## 2018-06-09 DIAGNOSIS — F17200 Nicotine dependence, unspecified, uncomplicated: Secondary | ICD-10-CM

## 2018-06-09 DIAGNOSIS — J069 Acute upper respiratory infection, unspecified: Secondary | ICD-10-CM | POA: Insufficient documentation

## 2018-06-09 MED ORDER — BICTEGRAVIR-EMTRICITAB-TENOFOV 50-200-25 MG PO TABS: 1.0000 | ORAL_TABLET | Freq: Every day | ORAL | 3 refills | Status: DC

## 2018-06-09 MED ORDER — AMOXICILLIN-POT CLAVULANATE 875-125 MG PO TABS: 1.0000 | ORAL_TABLET | Freq: Two times a day (BID) | ORAL | 0 refills | Status: DC

## 2018-06-09 NOTE — Patient Instructions (Signed)
Nice to meet you.  Continue to take your [place] as prescribed.  We will check your blood work today.   Start the Augmentin if your symptoms worsen or do not improve in the next 48 hours.  They will call to schedule your ultrasound of your liver.   Plan for follow up in 3 months or sooner if needed with blood work 1-2 weeks prior to appointment.

## 2018-06-09 NOTE — Assessment & Plan Note (Signed)
Jerry Finley appears to have adequately controlled HIV disease with a viral load that is undetectable. His adherence is questionable based on refills. Fortunately he has no signs/symptoms of opportunistic infection through history or physical exam. Due to his acute illness, we will hold immunizations today. Re-check viral load and CD4 count. Continue current dose of Biktarvy. UMAP has been renewed and is up to date. Plan for follow up in 3 months or sooner if needed with blood work 1-2 weeks prior to appointment.

## 2018-06-09 NOTE — Assessment & Plan Note (Signed)
Jerry Finley continues to smoke tobacco and marijuana. He is not ready to quit smoking at this time and is in the pre-contemplation stage of change. Encouraged for future health to avoid smoking in the future.

## 2018-06-09 NOTE — Assessment & Plan Note (Signed)
Mr. Crisp has crackles noted in his right upper lung with concern for developing pneumonia. He is not hypoxic at present and vital signs do not support a chest x-ray at this time as it still may be a viral infection. A written prescription for Augmentin was provided if symptoms worsen or don not improve in the next 24-48 hours. OTC medications as needed for symptom relief and supportive care. Follow up if symptoms worsen or do not improve.
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
## 2018-06-09 NOTE — Assessment & Plan Note (Signed)
Jerry Finley has elevated liver enzymes on multiple occasions now. He reports consuming alcohol on occasion with question of fatty liver disease although cannot rule out possible medication origin. Order for ultrasound placed today. Continue to monitor.

## 2018-06-11 LAB — T-HELPER CELL (CD4) - (RCID CLINIC ONLY)
CD4 % Helper T Cell: 24 % — ABNORMAL LOW (ref 33–55)
CD4 T Cell Abs: 740 /uL (ref 400–2700)

## 2018-06-11 LAB — HIV-1 RNA QUANT-NO REFLEX-BLD
HIV 1 RNA QUANT: 98 {copies}/mL — AB
HIV-1 RNA Quant, Log: 1.99 Log copies/mL — ABNORMAL HIGH

## 2018-06-18 ENCOUNTER — Other Ambulatory Visit: Payer: Medicaid Other

## 2018-06-21 ENCOUNTER — Encounter (HOSPITAL_COMMUNITY): Payer: Self-pay | Admitting: Emergency Medicine

## 2018-06-21 ENCOUNTER — Other Ambulatory Visit: Payer: Self-pay

## 2018-06-21 ENCOUNTER — Emergency Department (HOSPITAL_COMMUNITY)
Admission: EM | Admit: 2018-06-21 | Discharge: 2018-06-21 | Disposition: A | Discharge status: Home / Self Care | Payer: Medicaid Other | Attending: Emergency Medicine | Admitting: Emergency Medicine

## 2018-06-21 DIAGNOSIS — F1220 Cannabis dependence, uncomplicated: Secondary | ICD-10-CM | POA: Insufficient documentation

## 2018-06-21 DIAGNOSIS — F14259 Cocaine dependence with cocaine-induced psychotic disorder, unspecified: Secondary | ICD-10-CM | POA: Insufficient documentation

## 2018-06-21 DIAGNOSIS — F29 Unspecified psychosis not due to a substance or known physiological condition: Secondary | ICD-10-CM

## 2018-06-21 DIAGNOSIS — Z79899 Other long term (current) drug therapy: Secondary | ICD-10-CM | POA: Insufficient documentation

## 2018-06-21 DIAGNOSIS — F17210 Nicotine dependence, cigarettes, uncomplicated: Secondary | ICD-10-CM | POA: Insufficient documentation

## 2018-06-21 DIAGNOSIS — Z21 Asymptomatic human immunodeficiency virus [HIV] infection status: Secondary | ICD-10-CM | POA: Insufficient documentation

## 2018-06-21 LAB — CBC
HCT: 37.1 % — ABNORMAL LOW (ref 39.0–52.0)
Hemoglobin: 12.2 g/dL — ABNORMAL LOW (ref 13.0–17.0)
MCH: 29.6 pg (ref 26.0–34.0)
MCHC: 32.9 g/dL (ref 30.0–36.0)
MCV: 90 fL (ref 80.0–100.0)
PLATELETS: 485 10*3/uL — AB (ref 150–400)
RBC: 4.12 MIL/uL — AB (ref 4.22–5.81)
RDW: 13.3 % (ref 11.5–15.5)
WBC: 13.9 10*3/uL — AB (ref 4.0–10.5)
nRBC: 0 % (ref 0.0–0.2)

## 2018-06-21 LAB — COMPREHENSIVE METABOLIC PANEL
ALBUMIN: 3.7 g/dL (ref 3.5–5.0)
ALT: 21 U/L (ref 0–44)
ANION GAP: 17 — AB (ref 5–15)
AST: 48 U/L — AB (ref 15–41)
Alkaline Phosphatase: 105 U/L (ref 38–126)
BILIRUBIN TOTAL: 0.5 mg/dL (ref 0.3–1.2)
BUN: 15 mg/dL (ref 6–20)
CO2: 18 mmol/L — ABNORMAL LOW (ref 22–32)
Calcium: 9.2 mg/dL (ref 8.9–10.3)
Chloride: 104 mmol/L (ref 98–111)
Creatinine, Ser: 1.19 mg/dL (ref 0.61–1.24)
Glucose, Bld: 193 mg/dL — ABNORMAL HIGH (ref 70–99)
POTASSIUM: 3.7 mmol/L (ref 3.5–5.1)
SODIUM: 139 mmol/L (ref 135–145)
TOTAL PROTEIN: 8 g/dL (ref 6.5–8.1)

## 2018-06-21 LAB — RAPID URINE DRUG SCREEN, HOSP PERFORMED
AMPHETAMINES: POSITIVE — AB
Barbiturates: NOT DETECTED
Benzodiazepines: NOT DETECTED
COCAINE: POSITIVE — AB
OPIATES: NOT DETECTED
Tetrahydrocannabinol: POSITIVE — AB

## 2018-06-21 LAB — ACETAMINOPHEN LEVEL

## 2018-06-21 LAB — ETHANOL

## 2018-06-21 LAB — CK: Total CK: 955 U/L — ABNORMAL HIGH (ref 49–397)

## 2018-06-21 LAB — SALICYLATE LEVEL: Salicylate Lvl: <7 mg/dL (ref 2.8–30.0)

## 2018-06-21 LAB — LACTIC ACID, PLASMA: Lactic Acid, Venous: 2.5 mmol/L (ref 0.5–1.9)

## 2018-06-21 MED ORDER — SODIUM CHLORIDE 0.9 % IV BOLUS
1000.0000 mL | Freq: Once | INTRAVENOUS | Status: AC
  Administered 2018-06-21: 1000 mL via INTRAVENOUS

## 2018-06-21 MED ORDER — METHADONE HCL 10 MG PO TABS
90.0000 mg | ORAL_TABLET | Freq: Every day | ORAL | Status: DC
  Administered 2018-06-21: 90 mg via ORAL
  Filled 2018-06-21: qty 9

## 2018-06-21 MED ORDER — BICTEGRAVIR-EMTRICITAB-TENOFOV 50-200-25 MG PO TABS
1.0000 | ORAL_TABLET | Freq: Every day | ORAL | Status: DC
  Administered 2018-06-21: 1 via ORAL
  Filled 2018-06-21 (×2): qty 1

## 2018-06-21 NOTE — Discharge Instructions (Addendum)
As discussed, it is important that you follow up as soon as possible with your physician and mental heath providers for continued management of your condition.  If you develop any new, or concerning changes in your condition, please return to the emergency department immediately.

## 2018-06-21 NOTE — Progress Notes (Signed)
According to [facility] Rankin, NP, the patient has been PSYCHIATRICALLY CLEARED.   The patient does not meet inpatient criteria at this time. Disposition CSW notified the patient's RN and faxed additional resources to the patient at [facility] ED. Resources faxed to [facility] at [phone].    Jerrol Banana, RN notified.     Baldo Daub, MSW, LCSWA Clinical Social Worker [facility]  Phone: [phone]

## 2018-06-21 NOTE — ED Notes (Signed)
Pt denies SI/HI. Advised he needs his Metadone. Advised Pharm Tech to come review meds.
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
## 2018-06-21 NOTE — ED Notes (Addendum)
Pt changed into burgundy scrubs and yellow socks,  Security wanded pt, staffing and charge notified of [facility] sitter need.  Belongings bagged and placed in LOCKER #  2

## 2018-06-21 NOTE — ED Notes (Signed)
Gave patient a snack patient is resting with sitting at bedside
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
## 2018-06-21 NOTE — ED Notes (Signed)
TTS in process 

## 2018-06-21 NOTE — ED Notes (Signed)
Patient belongings inventoried and placed in locker #2; patient has 1 pair of shorts, 1 shirt and 1 pair of socks-Monique,RN

## 2018-06-21 NOTE — ED Triage Notes (Addendum)
Pt brought to triage by [facility] and multiple [employer] officers.  Pt was running in the road on [address], running up to houses, and beating on doors.  Pt anxious on EMS arrival.  Pt growling and trying to bite [employer] at the scene.  Pt alert and oriented.  States he took sleeping medication and just wants to go to bed.  Reports ETOH and marijuana use today.  Reports visual hallucinations.  Abrasions to bilateral knees.  Pt denies SI.  Handcuffed.  HR 150.  Charge RN notified of need for room.

## 2018-06-21 NOTE — BH Assessment (Addendum)
Tele Assessment Note   Patient Name: Jerry Finley MRN: [id number] Referring Physician: Jaci Carrel, MD Location of Patient: [facility], [phone] Location of Provider: Behavioral Health TTS Department  Jerry Finley is an 22 y.o. single male who presents unaccompanied to [facility] ED after being transported by [employer] [facility]. Triage note indicates Pt brought to triage by [facility] and multiple [employer] officers.  Pt was running in the road on [address], running up to houses, and beating on doors.  Pt anxious on EMS arrival.  Pt growling and trying to bite [employer] at the scene.  During assessment Pt states he took several different drugs "and it didn't work out well." Pt says took four tabs of a non-prescription sleeping medication, alcohol, cocaine, amphetamines and cannabis. He says he remembers what happened and he felt paranoid and that "someone was after me." Pt's medical record indicates he had similar bizarre behavior in August while using various illicit substances. Pt states he is feeling better now. He denies current suicidal ideation. He reports one previous suicide attempt as an adolescent by cutting himself. He denies homicidal ideation. He denies current auditory or visual hallucinations. Pt has a history of using a wide variety of drugs since age 77.   Pt reports he wanted to sleep tonight because he has been has been under stress. He says he and his girlfriend are having relationship problems and has a two-year-old child. 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 He reports he has a court date 07/09/18 for stealing a tip jar. He says he was diagnosed with HIV 2-3 years ago. Pt denies history of abuse or trauma.  Pt says he has no outpatient providers. He was diagnoses with ADHD as a child and took Ritalin but has not been prescribed any other psychiatric medications. He reports he was court ordered to substance abuse  treatment through [facility] 2-3 years ago. He denies any history of inpatient psychiatric treatment.  Pt is dressed in hospital scrubs, alert and oriented x4. Pt speaks in a slightly mumbled tone, at moderate volume and normal pace. Motor behavior appears normal. Eye contact is poor. Pt's mood is guilty and affect is congruent with mood. Thought process is coherent and relevant. There is no indication Pt is currently responding to internal stimuli or experiencing delusional thought content. Pt was calm and cooperative throughout assessment.  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 Concussion with loss of consciousness 06/25/2016  . 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 HIV (human immunodeficiency virus infection) (HCC)   . Legal problem 04/09/2016  . Nausea with vomiting 06/25/2016  . Smoker 11/20/2016    Past Surgical History:  Procedure Laterality Date  . HAND SURGERY      Family History: No family history on file.  Social History:  reports that he has been smoking cigarettes. He has been smoking about 1.00 pack per day. He has never used smokeless tobacco. He reports that he drinks alcohol. He reports that he has current or past drug history. Drugs: Cocaine, Marijuana, and Methamphetamines.  Additional Social History:  Alcohol / Drug Use Pain Medications: See MARs Prescriptions: See MARs Over the Counter: See MARs History of alcohol / drug use?: Yes  Longest period of sobriety (when/how long): "1 year while incarcerated"  Negative Consequences of Use: Personal relationships, Legal Substance #1 Name of Substance 1: Alcohol 1 - Age of First Use: 12 1 - Amount (size/oz): Varies 1 - Frequency: 3-4 times per week 1 - Duration:  Ongoing 1 - Last Use / Amount: 06/20/18, 3 beers Substance #2 Name of Substance 2: Cocaine 2 - Age of First Use: 12 2 - Amount (size/oz): $40-60 worth 2 - Frequency: varies 2 - Duration: Ongoing 2 - Last Use / Amount: 06/20/18, $40 worth Substance #3 Name of Substance 3: Amphetamines 3 - Age of First Use: 12 3 - Amount (size/oz): 1-2 pills 3 - Frequency: 3-4 times per month 3 - Duration: Ongoing 3 - Last Use / Amount: 06/20/18, 2 pills Substance #4 Name of Substance 4: Cannabis 4 - Age of First Use: 12 4 - Amount (size/oz): 0.5 grams 4 - Frequency: 3-4 times per week 4 - Duration: Ongoing 4 - Last Use / Amount: 06/20/18  CIWA: CIWA-Ar BP: 126/86 Pulse Rate: 74 COWS:    Allergies: No Known Allergies  Home Medications:  (Not in a hospital admission)  OB/GYN Status:  No LMP for male patient.  General Assessment Data Location of Assessment: [facility] ED TTS Assessment: In system Is this a Tele or Face-to-Face Assessment?: Tele Assessment Is this an Initial Assessment or a Re-assessment for this encounter?: Initial Assessment Patient Accompanied by:: (Alone) Language Other than English: No Living Arrangements: Other (Comment) What gender do you identify as?: Male Marital status: Single Maiden name: NA Pregnancy Status: No Living Arrangements: Spouse/significant other, Children Can pt return to current living arrangement?: Yes Admission Status: Voluntary Is patient capable of signing voluntary admission?: Yes Referral Source: Other(Law enforcement) Insurance type: Medicaid     Crisis Care Plan Living Arrangements: Spouse/significant other, Children Legal Guardian: Other:(Self) Name of Psychiatrist: None Name of Therapist: None  Education Status Is patient currently in school?: No Is the patient employed, unemployed or receiving disability?: Employed  Risk to self with the past 6 months Suicidal Ideation: No Has patient been a risk to self within the past 6  months prior to admission? : No Suicidal Intent: No Has patient had any suicidal intent within the past 6 months prior to admission? : No Is patient at risk for suicide?: No Suicidal Plan?: No Has patient had any suicidal plan within the past 6 months prior to admission? : No Access to Means: No What has been your use of drugs/alcohol within the last 12 months?: Pt using various substances Previous Attempts/Gestures: Yes How many times?: 1 Other Self Harm Risks: Pt reports a history of cutting as an adolescent Triggers for Past Attempts: Unknown Intentional Self Injurious Behavior: Cutting Comment - Self Injurious Behavior: Pt reports a history of cutting as adolescent Family Suicide History: Unknown Recent stressful life event(s): Financial Problems, Conflict (Comment), Legal Issues(Conflict with girlfriend) Persecutory voices/beliefs?: No Depression: Yes Depression Symptoms: Feeling angry/irritable, Insomnia Substance abuse history and/or treatment for substance abuse?: Yes Suicide prevention information given to non-admitted patients: Not applicable  Risk to Others within the past 6 months Homicidal Ideation: No Does patient have any lifetime risk of violence toward others beyond the six months prior to admission? : Yes (comment) Thoughts of Harm to Others: No Current Homicidal Intent: No Current Homicidal Plan: No Access to Homicidal Means: No Identified Victim: None History of harm to others?: No Assessment of Violence: On admission Violent Behavior Description: Trying to bite law enforcement on admission  Does patient have access to weapons?: No Criminal Charges Pending?: Yes Describe Pending Criminal Charges: Stealing Does patient have a court date: Yes Court Date: 07/09/18 Is patient on probation?: No  Psychosis Hallucinations: None noted Delusions: Persecutory(Paranoid when intoxicated)  Mental Status Report Appearance/Hygiene: In scrubs Eye Contact: Poor Motor  Activity: Unremarkable Speech: Logical/coherent Level of Consciousness: Drowsy Mood: Guilty Affect: Appropriate to circumstance Anxiety Level: None Thought Processes: Coherent, Relevant Judgement: Partial Orientation: Person, Place, Time, Situation Obsessive Compulsive Thoughts/Behaviors: None  Cognitive Functioning Concentration: Fair Memory: Recent Intact, Remote Intact Is patient IDD: No Insight: Poor Impulse Control: Poor Appetite: Fair Have you had any weight changes? : No Change Sleep: Decreased Total Hours of Sleep: 6 Vegetative Symptoms: None  ADLScreening [facility] Assessment Services) Patient's cognitive ability adequate to safely complete daily activities?: Yes Patient able to express need for assistance with ADLs?: Yes Independently performs ADLs?: Yes (appropriate for developmental age)  Prior Inpatient Therapy Prior Inpatient Therapy: No  Prior Outpatient Therapy Prior Outpatient Therapy: Yes Prior Therapy Dates: 2016 Prior Therapy Facilty/Provider(s): [facility] Reason for Treatment: Substance abuse Does patient have an ACCT team?: No Does patient have Intensive In-House Services?  : No Does patient have [facility] services? : No Does patient have P4CC services?: No  ADL Screening (condition at time of admission) Patient's cognitive ability adequate to safely complete daily activities?: Yes Is the patient deaf or have difficulty hearing?: No Does the patient have difficulty seeing, even when wearing glasses/contacts?: No Does the patient have difficulty concentrating, remembering, or making decisions?: No Patient able to express need for assistance with ADLs?: Yes Does the patient have difficulty dressing or bathing?: No Independently performs ADLs?: Yes (appropriate for developmental age) Does the patient have difficulty walking or climbing stairs?: No Weakness of Legs: None Weakness of Arms/Hands: None  Home Assistive  Devices/Equipment Home Assistive Devices/Equipment: None    Abuse/Neglect Assessment (Assessment to be complete while patient is alone) Abuse/Neglect Assessment Can Be Completed: Yes Physical Abuse: Denies Verbal Abuse: Denies Sexual Abuse: Denies Exploitation of patient/patient's resources: Denies Self-Neglect: Denies     Merchant navy officer (For Healthcare) Does Patient Have a Medical Advance Directive?: No Would patient like information on creating a medical advance directive?: No - Patient declined          Disposition: Gave clinical report to Donell Sievert, PA who recommended Pt be observed due to intoxication and evaluated by psychiatry later this morning. Notified Dr. Jaci Carrel and Adonis Brook, RN of recommendation.  Disposition Initial Assessment Completed for this Encounter: Yes Patient referred to: Other (Comment)  This service was provided via telemedicine using a 2-way, interactive audio and video technology.  Names of all persons participating in this telemedicine service and their role in this encounter. Name: Jettie Booze Role: Patient  Name: Shela Commons, [facility] Role: TTS counselor         Harlin Rain Patsy Baltimore, [facility], [facility], [facility] Triage Specialist [phone]  Pamalee Leyden 06/21/2018 6:53 AM

## 2018-07-09 ENCOUNTER — Emergency Department (HOSPITAL_COMMUNITY)
Admission: EM | Admit: 2018-07-09 | Discharge: 2018-07-10 | Disposition: A | Discharge status: Home / Self Care | Payer: Self-pay | Attending: Emergency Medicine | Admitting: Emergency Medicine

## 2018-07-09 DIAGNOSIS — F919 Conduct disorder, unspecified: Secondary | ICD-10-CM | POA: Insufficient documentation

## 2018-07-09 DIAGNOSIS — R4689 Other symptoms and signs involving appearance and behavior: Secondary | ICD-10-CM

## 2018-07-09 DIAGNOSIS — Z79899 Other long term (current) drug therapy: Secondary | ICD-10-CM | POA: Insufficient documentation

## 2018-07-09 DIAGNOSIS — Z21 Asymptomatic human immunodeficiency virus [HIV] infection status: Secondary | ICD-10-CM | POA: Insufficient documentation

## 2018-07-09 DIAGNOSIS — F17210 Nicotine dependence, cigarettes, uncomplicated: Secondary | ICD-10-CM | POA: Insufficient documentation

## 2018-07-09 DIAGNOSIS — F1910 Other psychoactive substance abuse, uncomplicated: Secondary | ICD-10-CM | POA: Insufficient documentation

## 2018-07-09 NOTE — ED Notes (Signed)
Bed: WA09 Expected date:  Expected time:  Means of arrival:  Comments: 22 yr old suicidal

## 2018-07-10 ENCOUNTER — Encounter (HOSPITAL_COMMUNITY): Payer: Self-pay | Admitting: Emergency Medicine

## 2018-07-10 ENCOUNTER — Emergency Department (HOSPITAL_COMMUNITY): Payer: Self-pay

## 2018-07-10 ENCOUNTER — Other Ambulatory Visit: Payer: Self-pay

## 2018-07-10 LAB — ACETAMINOPHEN LEVEL: Acetaminophen (Tylenol), Serum: <10 ug/mL — ABNORMAL LOW (ref 10–30)

## 2018-07-10 LAB — CBC WITH DIFFERENTIAL/PLATELET
Abs Immature Granulocytes: 0.11 10*3/uL — ABNORMAL HIGH (ref 0.00–0.07)
BASOS ABS: 0 10*3/uL (ref 0.0–0.1)
BASOS PCT: 0 %
EOS PCT: 0 %
Eosinophils Absolute: 0 10*3/uL (ref 0.0–0.5)
HCT: 38.8 % — ABNORMAL LOW (ref 39.0–52.0)
Hemoglobin: 12.6 g/dL — ABNORMAL LOW (ref 13.0–17.0)
Immature Granulocytes: 1 %
Lymphocytes Relative: 7 %
Lymphs Abs: 1.1 10*3/uL (ref 0.7–4.0)
MCH: 29.6 pg (ref 26.0–34.0)
MCHC: 32.5 g/dL (ref 30.0–36.0)
MCV: 91.1 fL (ref 80.0–100.0)
Monocytes Absolute: 1.2 10*3/uL — ABNORMAL HIGH (ref 0.1–1.0)
Monocytes Relative: 7 %
NEUTROS ABS: 14.2 10*3/uL — AB (ref 1.7–7.7)
NRBC: 0 % (ref 0.0–0.2)
Neutrophils Relative %: 85 %
PLATELETS: 283 10*3/uL (ref 150–400)
RBC: 4.26 MIL/uL (ref 4.22–5.81)
RDW: 14 % (ref 11.5–15.5)
WBC: 16.6 10*3/uL — ABNORMAL HIGH (ref 4.0–10.5)

## 2018-07-10 LAB — ETHANOL

## 2018-07-10 LAB — COMPREHENSIVE METABOLIC PANEL
ALBUMIN: 4.3 g/dL (ref 3.5–5.0)
ALK PHOS: 83 U/L (ref 38–126)
ALT: 30 U/L (ref 0–44)
ANION GAP: 10 (ref 5–15)
AST: 78 U/L — ABNORMAL HIGH (ref 15–41)
BUN: 21 mg/dL — ABNORMAL HIGH (ref 6–20)
CALCIUM: 8.6 mg/dL — AB (ref 8.9–10.3)
CHLORIDE: 103 mmol/L (ref 98–111)
CO2: 24 mmol/L (ref 22–32)
Creatinine, Ser: 1.04 mg/dL (ref 0.61–1.24)
GFR calc non Af Amer: >60 mL/min (ref >60)
GLUCOSE: 76 mg/dL (ref 70–99)
POTASSIUM: 4.8 mmol/L (ref 3.5–5.1)
SODIUM: 137 mmol/L (ref 135–145)
Total Bilirubin: 0.7 mg/dL (ref 0.3–1.2)
Total Protein: 7.6 g/dL (ref 6.5–8.1)

## 2018-07-10 LAB — RAPID URINE DRUG SCREEN, HOSP PERFORMED
AMPHETAMINES: NOT DETECTED
Barbiturates: NOT DETECTED
Benzodiazepines: NOT DETECTED
COCAINE: POSITIVE — AB
Opiates: NOT DETECTED
TETRAHYDROCANNABINOL: POSITIVE — AB

## 2018-07-10 LAB — SALICYLATE LEVEL: Salicylate Lvl: <7 mg/dL (ref 2.8–30.0)

## 2018-07-10 MED ORDER — TETANUS-DIPHTH-ACELL PERTUSSIS 5-2.5-18.5 LF-MCG/0.5 IM SUSP: 0.5000 mL | Freq: Once | INTRAMUSCULAR | Status: DC

## 2018-07-10 MED ORDER — SODIUM CHLORIDE 0.9 % IV BOLUS
1000.0000 mL | Freq: Once | INTRAVENOUS | Status: AC
  Administered 2018-07-10: 1000 mL via INTRAVENOUS

## 2018-07-10 NOTE — ED Provider Notes (Signed)
[facility]-EMERGENCY DEPT Provider Note   CSN: [id number] Arrival date & time: 07/09/18  2349     History   Chief Complaint Chief Complaint  Patient presents with  . Abnormal Behavior    HPI Jerry Finley is a 22 y.o. male.  Patient presents to the emergency department with a chief complaint of normal behavior.  He was brought in by [facility], after his mother called EMS and [employer] because he had been outside running around in the rain acting psychotic.  Patient reports that he feels like this after he takes cocaine.  States that he gets very paranoid.  States that he feels like people are after him.  He denies any pain, but is noted to have multiple abrasions on his extremities.    The history is provided by the patient. No language interpreter was used.    Past Medical History:  Diagnosis Date  . Concussion with loss of consciousness 06/25/2016  . 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 HIV (human immunodeficiency virus infection) (HCC)   . Legal problem 04/09/2016  . Nausea with vomiting 06/25/2016  . Smoker 11/20/2016    Patient Active Problem List   Diagnosis Date Noted  . Acute upper respiratory infection 06/09/2018  . Elevated liver enzymes 06/09/2018  . Cocaine abuse with cocaine-induced mood disorder (HCC) 04/02/2018  . Smoker 11/20/2016  . Concussion with loss of consciousness 06/25/2016  . Nausea with vomiting 06/25/2016  . 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 Legal problem 04/09/2016  . HIV disease (HCC) 03/20/2016    Past Surgical History:  Procedure Laterality Date  . HAND SURGERY          Home Medications    Prior to Admission medications   Medication Sig Start Date End Date Taking? Authorizing Provider  bictegravir-emtricitabine-tenofovir AF (BIKTARVY) 50-200-25 MG TABS tablet Take 1 tablet by mouth daily. 06/09/18   Veryl Speak, FNP  methadone (DOLOPHINE) 1 MG/1ML solution Take 90 mg by mouth  daily.     [provider]    Family History History reviewed. No pertinent family history.  Social History Social History   Tobacco Use  . Smoking status: Current Every Day Smoker    Packs/day: 1.00    Types: Cigarettes  . Smokeless tobacco: Never Used  Substance Use Topics  . Alcohol use: Yes  . Drug use: Yes    Types: Cocaine, Marijuana, Methamphetamines     Allergies   Patient has no known allergies.   Review of Systems Review of Systems  All other systems reviewed and are negative.    Physical Exam Updated Vital Signs BP (!) 147/75 (BP Location: Left Arm)   Pulse 98   Temp 97.9 F (36.6 C) (Oral)   Resp 12   SpO2 100%   Physical Exam  Constitutional: He is oriented to person, place, and time. He appears well-developed and well-nourished.  HENT:  Head: Normocephalic and atraumatic.  Eyes: Pupils are equal, round, and reactive to light. Conjunctivae and EOM are normal. Right eye exhibits no discharge. Left eye exhibits no discharge. No scleral icterus.  Neck: Normal range of motion. Neck supple. No JVD present.  Cardiovascular: Normal rate, regular rhythm and normal heart sounds. Exam reveals no gallop and no friction rub.  No murmur heard. Pulmonary/Chest: Effort normal and breath sounds normal. No respiratory distress. He has no wheezes. He has no rales. He exhibits no tenderness.  Abdominal: Soft. He exhibits no distension and  no mass. There is no tenderness. There is no rebound and no guarding.  Musculoskeletal: Normal range of motion. He exhibits no edema or tenderness.  Large contusion to the radial aspect of the distal left wrist  Neurological: He is alert and oriented to person, place, and time.  Skin: Skin is warm and dry.  Numerous abrasions to upper and lower extremities, there is 1 small gaping laceration on the left hand without evidence of foreign body  Psychiatric: He has a normal mood and affect. His behavior is normal. Judgment and  thought content normal.  Nursing note and vitals reviewed.    ED Treatments / Results  Labs (all labs ordered are listed, but only abnormal results are displayed) Labs Reviewed  CBC WITH DIFFERENTIAL/PLATELET  COMPREHENSIVE METABOLIC PANEL  RAPID URINE DRUG SCREEN, HOSP PERFORMED  ETHANOL  ACETAMINOPHEN LEVEL  SALICYLATE LEVEL    EKG None  Radiology No results found.  Procedures Procedures (including critical care time) LACERATION REPAIR Performed by: Roxy Horseman Authorized by: Roxy Horseman Consent: Verbal consent obtained. Risks and benefits: risks, benefits and alternatives were discussed Consent given by: patient Patient identity confirmed: provided demographic data Prepped and Draped in normal sterile fashion Wound explored  Laceration Location: left hand  Laceration Length: 1cm  No Foreign Bodies seen or palpated  Anesthesia: none  Local anesthetic: none  Anesthetic total: none  Irrigation method: syringe Amount of cleaning: standard  Skin closure: dermabond  Number of sutures: dermabond  Technique: dermabond  Patient tolerance: Patient tolerated the procedure well with no immediate complications.  Medications Ordered in ED Medications  Tdap (BOOSTRIX) injection 0.5 mL (has no administration in time range)  sodium chloride 0.9 % bolus 1,000 mL (has no administration in time range)     Initial Impression / Assessment and Plan / ED Course  I have reviewed the triage vital signs and the nursing notes.  Pertinent labs & imaging results that were available during my care of the patient were reviewed by me and considered in my medical decision making (see chart for details).     Patient with abnormal behavior after using drugs tonight.  Laboratory work-up is reassuring.  Vital signs are stable.  DC to [employer] custody and to jail.  Final Clinical Impressions(s) / ED Diagnoses   Final diagnoses:  Abnormal behavior  Polysubstance abuse  [facility])    ED Discharge Orders    None       Roxy Horseman, PA-C 07/10/18 0141    Molpus, Jonny Ruiz, MD 07/10/18 1610

## 2018-07-10 NOTE — ED Triage Notes (Signed)
Pt is brought in by EMS from home with c/o abnormal behavior.  Pt's mother called out EMS and reported that pt has "been running around the streets under the rain".  Pt was subdued by [employer] on scene.  Pt admits to taking "crack and drinking alcohol tonight".  Pt denies SI/HI.  Hx of psychosis.  Pt stated, "I just can't help it, it has to with the drugs".  Pt sustained multiple abrasions and skin tears to both arms and feet, bleeding controlled.

## 2018-07-18 ENCOUNTER — Ambulatory Visit (HOSPITAL_COMMUNITY)
Admission: EM | Admit: 2018-07-18 | Discharge: 2018-07-18 | Disposition: A | Discharge status: Home / Self Care | Payer: Self-pay | Attending: Internal Medicine | Admitting: Internal Medicine

## 2018-07-18 ENCOUNTER — Encounter (HOSPITAL_COMMUNITY): Payer: Self-pay | Admitting: Emergency Medicine

## 2018-07-18 DIAGNOSIS — Z09 Encounter for follow-up examination after completed treatment for conditions other than malignant neoplasm: Secondary | ICD-10-CM

## 2018-07-18 DIAGNOSIS — Z0289 Encounter for other administrative examinations: Secondary | ICD-10-CM

## 2018-07-18 DIAGNOSIS — R071 Chest pain on breathing: Secondary | ICD-10-CM

## 2018-07-18 NOTE — ED Triage Notes (Signed)
Pt states for the past few weeks hes had painful breathing. resp equal and unlabored. Pt states he is trying to get into a treatment center for substance abuse and needs the okay to go there.

## 2018-07-18 NOTE — Discharge Instructions (Addendum)
No evidence of pneumonia or other acute lung issue on exam today.  Cleared for treatment center admission as planned. Recheck as needed.

## 2018-07-18 NOTE — ED Provider Notes (Signed)
[facility]    CSN: [id number] Arrival date & time: 07/18/18  0920     History   Chief Complaint Chief Complaint  Patient presents with  . Painful Breathing    HPI Jerry Finley is a 22 y.o. male.   He presents today before scheduled admission to an inpatient treatment center, to follow-up concerns of painful breathing in the last month.  Painful breathing has resolved, he had an episode where it was uncomfortable to exhale fully, and also had some discomfort with inspiration, took about 4 days to resolve.  He currently has no change in chronic productive cough, no fever.  No nausea/vomiting, appetite is okay, no diarrhea, no constipation.  No leg swelling.    HPI  Past Medical History:  Diagnosis Date  . Concussion with loss of consciousness 06/25/2016  . 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 HIV (human immunodeficiency virus infection) (HCC)   . Legal problem 04/09/2016  . Nausea with vomiting 06/25/2016  . Smoker 11/20/2016    Patient Active Problem List   Diagnosis Date Noted  . Acute upper respiratory infection 06/09/2018  . Elevated liver enzymes 06/09/2018  . Cocaine abuse with cocaine-induced mood disorder (HCC) 04/02/2018  . Smoker 11/20/2016  . Concussion with loss of consciousness 06/25/2016  . Nausea with vomiting 06/25/2016  . 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 Legal problem 04/09/2016  . HIV disease (HCC) 03/20/2016    Past Surgical History:  Procedure Laterality Date  . HAND SURGERY         Home Medications    Prior to Admission medications   Medication Sig Start Date End Date Taking? Authorizing Provider  bictegravir-emtricitabine-tenofovir AF (BIKTARVY) 50-200-25 MG TABS tablet Take 1 tablet by mouth daily. 06/09/18   Veryl Speak, FNP  methadone (DOLOPHINE) 1 MG/1ML solution Take 90 mg by mouth daily.     [provider]    Family History No family history on file.  Social  History Social History   Tobacco Use  . Smoking status: Current Every Day Smoker    Packs/day: 1.00    Types: Cigarettes  . Smokeless tobacco: Never Used  Substance Use Topics  . Alcohol use: Yes  . Drug use: Yes    Types: Cocaine, Marijuana, Methamphetamines     Allergies   Patient has no known allergies.   Review of Systems Review of Systems  All other systems reviewed and are negative.    Physical Exam Triage Vital Signs ED Triage Vitals [07/18/18 1006]  Enc Vitals Group     BP 136/68     Pulse Rate 81     Resp 16     Temp 97.7 F (36.5 C)     Temp src      SpO2 99 %     Weight      Height      Pain Score 0     Pain Loc    Updated Vital Signs BP 136/68   Pulse 81   Temp 97.7 F (36.5 C)   Resp 16   SpO2 99%  Physical Exam  Constitutional: He is oriented to person, place, and time. No distress.  Alert, nicely groomed  HENT:  Head: Atraumatic.  Eyes:  Conjugate gaze, no eye redness/drainage  Neck: Neck supple.  Cardiovascular: Normal rate and regular rhythm.  Pulmonary/Chest: No stridor. No respiratory distress. He has no wheezes.  Lungs clear/quiet, symmetric breath sounds  Abdominal: He exhibits no  distension.  Musculoskeletal: Normal range of motion. He exhibits no edema.  Neurological: He is alert and oriented to person, place, and time.  Skin: Skin is warm and dry.  No cyanosis  Nursing note and vitals reviewed.     Final Clinical Impressions(s) / UC Diagnoses   Final diagnoses:  Encounter for follow-up examination     Discharge Instructions     No evidence of pneumonia or other acute lung issue on exam today.  Cleared for treatment center admission as planned. Recheck as needed.      Isa Rankin, MD 07/21/18 2119

## 2018-08-11 ENCOUNTER — Emergency Department (HOSPITAL_COMMUNITY): Payer: Self-pay

## 2018-08-11 ENCOUNTER — Other Ambulatory Visit: Payer: Self-pay

## 2018-08-11 ENCOUNTER — Emergency Department (HOSPITAL_COMMUNITY)
Admission: EM | Admit: 2018-08-11 | Discharge: 2018-08-11 | Disposition: A | Discharge status: Home / Self Care | Payer: Self-pay | Attending: Emergency Medicine | Admitting: Emergency Medicine

## 2018-08-11 ENCOUNTER — Encounter (HOSPITAL_COMMUNITY): Payer: Self-pay

## 2018-08-11 DIAGNOSIS — Y929 Unspecified place or not applicable: Secondary | ICD-10-CM | POA: Insufficient documentation

## 2018-08-11 DIAGNOSIS — W208XXA Other cause of strike by thrown, projected or falling object, initial encounter: Secondary | ICD-10-CM | POA: Insufficient documentation

## 2018-08-11 DIAGNOSIS — S0181XA Laceration without foreign body of other part of head, initial encounter: Secondary | ICD-10-CM

## 2018-08-11 DIAGNOSIS — Z79899 Other long term (current) drug therapy: Secondary | ICD-10-CM | POA: Insufficient documentation

## 2018-08-11 DIAGNOSIS — Y9389 Activity, other specified: Secondary | ICD-10-CM | POA: Insufficient documentation

## 2018-08-11 DIAGNOSIS — F17210 Nicotine dependence, cigarettes, uncomplicated: Secondary | ICD-10-CM | POA: Insufficient documentation

## 2018-08-11 DIAGNOSIS — Y999 Unspecified external cause status: Secondary | ICD-10-CM | POA: Insufficient documentation

## 2018-08-11 MED ORDER — LIDOCAINE-EPINEPHRINE-TETRACAINE (LET) SOLUTION
3.0000 mL | Freq: Once | NASAL | Status: AC
  Administered 2018-08-11: 3 mL via TOPICAL
  Filled 2018-08-11: qty 3

## 2018-08-11 MED ORDER — LIDOCAINE-EPINEPHRINE (PF) 2 %-1:200000 IJ SOLN
10.0000 mL | Freq: Once | INTRAMUSCULAR | Status: AC
  Administered 2018-08-11: 10 mL via INTRADERMAL
  Filled 2018-08-11: qty 10

## 2018-08-11 NOTE — ED Notes (Signed)
Patient transported to X-ray 

## 2018-08-11 NOTE — ED Triage Notes (Signed)
Per EMS- patient was using a table saw and let go of the board he was cutting and the board flipped up, hitting the patient in the jaw, patient has a 1/2 laceration to the left jaw area.

## 2018-08-11 NOTE — ED Provider Notes (Signed)
[facility]-EMERGENCY DEPT Provider Note   CSN: [id number] Arrival date & time: 08/11/18  1606     History   Chief Complaint Chief Complaint  Patient presents with  . Facial Injury  . Laceration    HPI Jerry Finley is a 22 y.o. male.  22 year old male brought in by EMS for laceration to left side of his jaw.  Patient was using a table saw to cut a board when he let go of the board and the board came up and hit him in the face.  Denies loss of consciousness, not on blood thinners, last tetanus was less than 5 years ago, denies any injuries to his teeth, states he has some pain with opening his mouth.  Other injuries or concerns.     Past Medical History:  Diagnosis Date  . Concussion with loss of consciousness 06/25/2016  . 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 HIV (human immunodeficiency virus infection) (HCC)   . Legal problem 04/09/2016  . Nausea with vomiting 06/25/2016  . Smoker 11/20/2016    Patient Active Problem List   Diagnosis Date Noted  . Acute upper respiratory infection 06/09/2018  . Elevated liver enzymes 06/09/2018  . Cocaine abuse with cocaine-induced mood disorder (HCC) 04/02/2018  . Smoker 11/20/2016  . Concussion with loss of consciousness 06/25/2016  . Nausea with vomiting 06/25/2016  . 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 Legal problem 04/09/2016  . HIV disease (HCC) 03/20/2016    Past Surgical History:  Procedure Laterality Date  . HAND SURGERY          Home Medications    Prior to Admission medications   Medication Sig Start Date End Date Taking? Authorizing Provider  bictegravir-emtricitabine-tenofovir AF (BIKTARVY) 50-200-25 MG TABS tablet Take 1 tablet by mouth daily. 06/09/18   Veryl Speak, FNP  methadone (DOLOPHINE) 1 MG/1ML solution Take 90 mg by mouth daily.     [provider]    Family History Family History  Problem Relation Age of Onset  . Healthy  Mother   . Healthy Father     Social History Social History   Tobacco Use  . Smoking status: Current Every Day Smoker    Packs/day: 1.00    Types: Cigarettes  . Smokeless tobacco: Never Used  Substance Use Topics  . Alcohol use: Yes    Comment: socially  . Drug use: Not Currently    Types: Cocaine, Marijuana, Methamphetamines     Allergies   Patient has no known allergies.   Review of Systems Review of Systems  Constitutional: Negative for fever.  HENT: Negative for dental problem.   Musculoskeletal: Negative for neck pain and neck stiffness.  Skin: Positive for wound.  Allergic/Immunologic: Positive for immunocompromised state.  Neurological: Negative for headaches.  Hematological: Does not bruise/bleed easily.  Psychiatric/Behavioral: Negative for confusion.  All other systems reviewed and are negative.    Physical Exam Updated Vital Signs BP 128/78 (BP Location: Left Arm)   Pulse 87   Temp 98 F (36.7 C) (Oral)   Resp 15   Ht 5\' 7"  (1.702 m)   Wt 59 kg   SpO2 97%   BMI 20.36 kg/m   Physical Exam  Constitutional: He is oriented to person, place, and time. He appears well-developed and well-nourished. No distress.  HENT:  Head: Normocephalic and atraumatic.    Mouth/Throat:    Eyes: Pupils are equal, round, and reactive to light. EOM are normal.  Neck: Neck supple.  Pulmonary/Chest: Effort normal.  Musculoskeletal: He exhibits tenderness.  Neurological: He is alert and oriented to person, place, and time.  Skin: Skin is warm and dry. He is not diaphoretic.  Psychiatric: He has a normal mood and affect. His behavior is normal.  Nursing note and vitals reviewed.    ED Treatments / Results  Labs (all labs ordered are listed, but only abnormal results are displayed) Labs Reviewed - No data to display  EKG None  Radiology Dg Mandible 4 Views  Result Date: 08/11/2018 CLINICAL DATA:  Left jaw pain after injury. EXAM: MANDIBLE - 4+ VIEW  COMPARISON:  None. FINDINGS: There is no evidence of fracture or other focal bone lesions. IMPRESSION: Negative. Electronically Signed   By: Lupita RaiderJames  Green Jr, M.D.   On: 08/11/2018 18:15    Procedures .Marland Kitchen.Laceration Repair Date/Time: 08/11/2018 6:47 PM Performed by: Jeannie FendMurphy,  A, PA-C Authorized by: Jeannie FendMurphy,  A, PA-C   Consent:    Consent obtained:  Verbal   Consent given by:  Patient   Risks discussed:  Infection, need for additional repair, pain, poor cosmetic result and poor wound healing   Alternatives discussed:  No treatment and delayed treatment Universal protocol:    Procedure explained and questions answered to patient or proxy's satisfaction: yes     Imaging studies available: yes     Immediately prior to procedure, a time out was called: yes     Patient identity confirmed:  Verbally with patient Anesthesia (see MAR for exact dosages):    Anesthesia method:  Local infiltration and topical application   Topical anesthetic:  LET   Local anesthetic:  Lidocaine 2% WITH epi Laceration details:    Location:  Face   Face location:  Chin   Length (cm):  2.5   Depth (mm):  4 Repair type:    Repair type:  Simple Pre-procedure details:    Preparation:  Imaging obtained to evaluate for foreign bodies and patient was prepped and draped in usual sterile fashion Exploration:    Wound exploration: wound explored through full range of motion and entire depth of wound probed and visualized     Wound extent: no foreign bodies/material noted and no underlying fracture noted     Contaminated: no   Treatment:    Area cleansed with:  Saline   Amount of cleaning:  Standard   Irrigation solution:  Sterile saline Skin repair:    Repair method:  Sutures   Suture size:  5-0   Suture material:  Prolene   Suture technique:  Simple interrupted   Number of sutures:  3 Approximation:    Approximation:  Close Post-procedure details:    Dressing:  Open (no dressing)   Patient tolerance of  procedure:  Tolerated well, no immediate complications   (including critical care time)  Medications Ordered in ED Medications  lidocaine-EPINEPHrine-tetracaine (LET) solution (3 mLs Topical Given 08/11/18 1728)  lidocaine-EPINEPHrine (XYLOCAINE W/EPI) 2 %-1:200000 (PF) injection 10 mL (10 mLs Intradermal Given 08/11/18 1731)     Initial Impression / Assessment and Plan / ED Course  I have reviewed the triage vital signs and the nursing notes.  Pertinent labs & imaging results that were available during my care of the patient were reviewed by me and considered in my medical decision making (see chart for details).  Clinical Course as of Aug 11 1850  Mon Aug 11, 2018  [id number] 22 year old male brought in by EMS for laceration to the left chin area  as result of a board that hit him in the face while using a table saw.  Patient reports pain in his jaw, has pain with biting down on tongue depressor.  X-ray is negative for fracture.  Wound was irrigated and closed with 3 simple interrupted sutures.  Recommend wound check in 2 days and suture removal in 5 to 7 days.  No other injuries, patient ready for discharge after a light snack.   [LM]    Clinical Course User Index [LM] Jeannie Fend, PA-C   Final Clinical Impressions(s) / ED Diagnoses   Final diagnoses:  Facial laceration, initial encounter    ED Discharge Orders    None       Jeannie Fend, PA-C 08/11/18 1851    Donnetta Hutching, MD 08/12/18 1544

## 2018-08-11 NOTE — Discharge Instructions (Addendum)
Wound check with your doctor in 2 days, suture removal in 5-7 days.

## 2018-08-25 ENCOUNTER — Other Ambulatory Visit: Payer: Medicaid Other

## 2018-09-08 ENCOUNTER — Encounter: Payer: Medicaid Other | Admitting: Family

## 2018-10-13 ENCOUNTER — Other Ambulatory Visit: Payer: Medicaid Other

## 2018-10-27 ENCOUNTER — Encounter: Payer: Medicaid Other | Admitting: Family

## 2018-11-20 ENCOUNTER — Encounter (HOSPITAL_COMMUNITY): Payer: Self-pay

## 2018-11-20 ENCOUNTER — Emergency Department (HOSPITAL_COMMUNITY)
Admission: EM | Admit: 2018-11-20 | Discharge: 2018-11-21 | Disposition: A | Discharge status: Home / Self Care | Payer: Medicaid Other | Attending: Emergency Medicine | Admitting: Emergency Medicine

## 2018-11-20 ENCOUNTER — Other Ambulatory Visit: Payer: Self-pay

## 2018-11-20 DIAGNOSIS — Z79899 Other long term (current) drug therapy: Secondary | ICD-10-CM | POA: Insufficient documentation

## 2018-11-20 DIAGNOSIS — B20 Human immunodeficiency virus [HIV] disease: Secondary | ICD-10-CM | POA: Insufficient documentation

## 2018-11-20 DIAGNOSIS — F17210 Nicotine dependence, cigarettes, uncomplicated: Secondary | ICD-10-CM | POA: Insufficient documentation

## 2018-11-20 DIAGNOSIS — R4689 Other symptoms and signs involving appearance and behavior: Secondary | ICD-10-CM

## 2018-11-20 DIAGNOSIS — R4589 Other symptoms and signs involving emotional state: Secondary | ICD-10-CM | POA: Insufficient documentation
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
## 2018-11-20 NOTE — ED Notes (Signed)
Bed: WA20 Expected date:  Expected time:  Means of arrival:  Comments: SI/chemically restrained
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
## 2018-11-21 LAB — RAPID HIV SCREEN (HIV 1/2 AB+AG)
HIV 1/2 Antibodies: REACTIVE — AB
HIV-1 P24 Antigen - HIV24: NONREACTIVE

## 2018-11-21 LAB — HEPATITIS B SURFACE ANTIGEN: Hepatitis B Surface Ag: NEGATIVE

## 2018-11-21 NOTE — ED Notes (Signed)
Labs recollected and sent to lab at this time.  Original specimens lab stated they never received.
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
## 2018-11-22 ENCOUNTER — Encounter (HOSPITAL_COMMUNITY): Payer: Self-pay | Admitting: Emergency Medicine

## 2018-11-22 ENCOUNTER — Emergency Department (HOSPITAL_COMMUNITY): Payer: Self-pay

## 2018-11-22 ENCOUNTER — Inpatient Hospital Stay (HOSPITAL_COMMUNITY)
Admission: EM | Admit: 2018-11-22 | Discharge: 2018-11-27 | DRG: 208 | Payer: Self-pay | Attending: Emergency Medicine | Admitting: Emergency Medicine

## 2018-11-22 ENCOUNTER — Inpatient Hospital Stay (HOSPITAL_COMMUNITY): Payer: Self-pay

## 2018-11-22 DIAGNOSIS — F4325 Adjustment disorder with mixed disturbance of emotions and conduct: Secondary | ICD-10-CM

## 2018-11-22 DIAGNOSIS — S22080A Wedge compression fracture of T11-T12 vertebra, initial encounter for closed fracture: Secondary | ICD-10-CM | POA: Diagnosis present

## 2018-11-22 DIAGNOSIS — S2249XA Multiple fractures of ribs, unspecified side, initial encounter for closed fracture: Secondary | ICD-10-CM

## 2018-11-22 DIAGNOSIS — R569 Unspecified convulsions: Secondary | ICD-10-CM

## 2018-11-22 DIAGNOSIS — Z781 Physical restraint status: Secondary | ICD-10-CM

## 2018-11-22 DIAGNOSIS — S42102A Fracture of unspecified part of scapula, left shoulder, initial encounter for closed fracture: Secondary | ICD-10-CM | POA: Diagnosis present

## 2018-11-22 DIAGNOSIS — R402123 Coma scale, eyes open, to pain, at hospital admission: Secondary | ICD-10-CM | POA: Diagnosis present

## 2018-11-22 DIAGNOSIS — R45851 Suicidal ideations: Secondary | ICD-10-CM | POA: Diagnosis present

## 2018-11-22 DIAGNOSIS — S2242XA Multiple fractures of ribs, left side, initial encounter for closed fracture: Secondary | ICD-10-CM | POA: Diagnosis present

## 2018-11-22 DIAGNOSIS — T07XXXA Unspecified multiple injuries, initial encounter: Secondary | ICD-10-CM | POA: Diagnosis present

## 2018-11-22 DIAGNOSIS — F329 Major depressive disorder, single episode, unspecified: Secondary | ICD-10-CM | POA: Diagnosis present

## 2018-11-22 DIAGNOSIS — S32020A Wedge compression fracture of second lumbar vertebra, initial encounter for closed fracture: Secondary | ICD-10-CM | POA: Diagnosis present

## 2018-11-22 DIAGNOSIS — Z818 Family history of other mental and behavioral disorders: Secondary | ICD-10-CM

## 2018-11-22 DIAGNOSIS — Z681 Body mass index (BMI) 19 or less, adult: Secondary | ICD-10-CM

## 2018-11-22 DIAGNOSIS — R402343 Coma scale, best motor response, flexion withdrawal, at hospital admission: Secondary | ICD-10-CM | POA: Diagnosis present

## 2018-11-22 DIAGNOSIS — R64 Cachexia: Secondary | ICD-10-CM | POA: Diagnosis present

## 2018-11-22 DIAGNOSIS — Z21 Asymptomatic human immunodeficiency virus [HIV] infection status: Secondary | ICD-10-CM | POA: Diagnosis present

## 2018-11-22 DIAGNOSIS — S2242XB Multiple fractures of ribs, left side, initial encounter for open fracture: Secondary | ICD-10-CM | POA: Diagnosis present

## 2018-11-22 DIAGNOSIS — J9600 Acute respiratory failure, unspecified whether with hypoxia or hypercapnia: Secondary | ICD-10-CM | POA: Diagnosis present

## 2018-11-22 DIAGNOSIS — Y30XXXA Falling, jumping or pushed from a high place, undetermined intent, initial encounter: Secondary | ICD-10-CM

## 2018-11-22 DIAGNOSIS — S270XXA Traumatic pneumothorax, initial encounter: Principal | ICD-10-CM | POA: Diagnosis present

## 2018-11-22 DIAGNOSIS — S42109A Fracture of unspecified part of scapula, unspecified shoulder, initial encounter for closed fracture: Secondary | ICD-10-CM

## 2018-11-22 DIAGNOSIS — S42002A Fracture of unspecified part of left clavicle, initial encounter for closed fracture: Secondary | ICD-10-CM | POA: Diagnosis present

## 2018-11-22 DIAGNOSIS — F17210 Nicotine dependence, cigarettes, uncomplicated: Secondary | ICD-10-CM | POA: Diagnosis present

## 2018-11-22 DIAGNOSIS — S22050A Wedge compression fracture of T5-T6 vertebra, initial encounter for closed fracture: Secondary | ICD-10-CM | POA: Diagnosis present

## 2018-11-22 DIAGNOSIS — R402213 Coma scale, best verbal response, none, at hospital admission: Secondary | ICD-10-CM | POA: Diagnosis present

## 2018-11-22 DIAGNOSIS — S0990XA Unspecified injury of head, initial encounter: Secondary | ICD-10-CM | POA: Diagnosis present

## 2018-11-22 LAB — MRSA PCR SCREENING: MRSA by PCR: NEGATIVE

## 2018-11-22 LAB — TRIGLYCERIDES: Triglycerides: 77 mg/dL (ref <150)

## 2018-11-22 LAB — PREPARE FRESH FROZEN PLASMA
UNIT DIVISION: 0
Unit division: 0

## 2018-11-22 LAB — CBC WITH DIFFERENTIAL/PLATELET
Abs Immature Granulocytes: 0.27 10*3/uL — ABNORMAL HIGH (ref 0.00–0.07)
Basophils Absolute: 0 10*3/uL (ref 0.0–0.1)
Basophils Relative: 0 %
EOS PCT: 1 %
Eosinophils Absolute: 0.1 10*3/uL (ref 0.0–0.5)
HCT: 43.7 % (ref 39.0–52.0)
Hemoglobin: 14.3 g/dL (ref 13.0–17.0)
Immature Granulocytes: 2 %
LYMPHS PCT: 28 %
Lymphs Abs: 3.4 10*3/uL (ref 0.7–4.0)
MCH: 29.8 pg (ref 26.0–34.0)
MCHC: 32.7 g/dL (ref 30.0–36.0)
MCV: 91 fL (ref 80.0–100.0)
Monocytes Absolute: 0.8 10*3/uL (ref 0.1–1.0)
Monocytes Relative: 6 %
Neutro Abs: 7.6 10*3/uL (ref 1.7–7.7)
Neutrophils Relative %: 63 %
Platelets: 354 10*3/uL (ref 150–400)
RBC: 4.8 MIL/uL (ref 4.22–5.81)
RDW: 13 % (ref 11.5–15.5)
WBC: 12 10*3/uL — ABNORMAL HIGH (ref 4.0–10.5)
nRBC: 0 % (ref 0.0–0.2)

## 2018-11-22 LAB — COMPREHENSIVE METABOLIC PANEL
ALT: 27 U/L (ref 0–44)
ANION GAP: 11 (ref 5–15)
AST: 68 U/L — ABNORMAL HIGH (ref 15–41)
Albumin: 4.2 g/dL (ref 3.5–5.0)
Alkaline Phosphatase: 89 U/L (ref 38–126)
BUN: 12 mg/dL (ref 6–20)
CO2: 22 mmol/L (ref 22–32)
Calcium: 9.2 mg/dL (ref 8.9–10.3)
Chloride: 103 mmol/L (ref 98–111)
Creatinine, Ser: 0.86 mg/dL (ref 0.61–1.24)
GFR calc Af Amer: >60 mL/min (ref >60)
GFR calc non Af Amer: >60 mL/min (ref >60)
GLUCOSE: 160 mg/dL — AB (ref 70–99)
Potassium: 3.8 mmol/L (ref 3.5–5.1)
Sodium: 136 mmol/L (ref 135–145)
Total Bilirubin: 1.3 mg/dL — ABNORMAL HIGH (ref 0.3–1.2)
Total Protein: 7.4 g/dL (ref 6.5–8.1)

## 2018-11-22 LAB — BPAM FFP
Blood Product Expiration Date: 202003182359
Blood Product Expiration Date: 202003182359
ISSUE DATE / TIME: 202003141227
ISSUE DATE / TIME: 202003141227
Unit Type and Rh: 600
Unit Type and Rh: 6200

## 2018-11-22 LAB — ETHANOL: Alcohol, Ethyl (B): <10 mg/dL (ref <10)

## 2018-11-22 LAB — ABO/RH: ABO/RH(D): O NEG

## 2018-11-22 LAB — HIV-1 RNA QUANT-NO REFLEX-BLD
HIV 1 RNA Quant: 50 copies/mL
LOG10 HIV-1 RNA: 1.699 log10copy/mL

## 2018-11-22 MED ORDER — PROPOFOL 1000 MG/100ML IV EMUL
INTRAVENOUS | Status: AC
  Filled 2018-11-22: qty 100

## 2018-11-22 MED ORDER — FENTANYL BOLUS VIA INFUSION
30.0000 ug | INTRAVENOUS | Status: DC | PRN
  Filled 2018-11-22: qty 30

## 2018-11-22 MED ORDER — PROPOFOL 1000 MG/100ML IV EMUL: 0.0000 ug/kg/min | INTRAVENOUS | Status: DC

## 2018-11-22 MED ORDER — FENTANYL CITRATE (PF) 100 MCG/2ML IJ SOLN
50.0000 ug | Freq: Once | INTRAMUSCULAR | Status: AC
  Administered 2018-11-22: 50 ug via INTRAVENOUS

## 2018-11-22 MED ORDER — MIDAZOLAM HCL 2 MG/2ML IJ SOLN
INTRAMUSCULAR | Status: AC
  Administered 2018-11-22: 2 mg
  Filled 2018-11-22: qty 2

## 2018-11-22 MED ORDER — FENTANYL 2500MCG IN NS 250ML (10MCG/ML) PREMIX INFUSION
0.0000 ug/h | INTRAVENOUS | Status: DC
  Filled 2018-11-22: qty 250

## 2018-11-22 MED ORDER — ONDANSETRON 4 MG PO TBDP: 4.0000 mg | ORAL_TABLET | Freq: Four times a day (QID) | ORAL | Status: DC | PRN

## 2018-11-22 MED ORDER — CHLORHEXIDINE GLUCONATE 0.12% ORAL RINSE (MEDLINE KIT)
15.0000 mL | Freq: Two times a day (BID) | OROMUCOSAL | Status: DC
  Administered 2018-11-22 – 2018-11-23 (×3): 15 mL via OROMUCOSAL

## 2018-11-22 MED ORDER — SUCCINYLCHOLINE CHLORIDE 20 MG/ML IJ SOLN
INTRAMUSCULAR | Status: AC | PRN
  Administered 2018-11-22: 100 mg via INTRAVENOUS

## 2018-11-22 MED ORDER — FENTANYL BOLUS VIA INFUSION
50.0000 ug | INTRAVENOUS | Status: DC | PRN
  Filled 2018-11-22: qty 50

## 2018-11-22 MED ORDER — ENOXAPARIN SODIUM 40 MG/0.4ML ~~LOC~~ SOLN
40.0000 mg | SUBCUTANEOUS | Status: DC
  Administered 2018-11-24 – 2018-11-27 (×4): 40 mg via SUBCUTANEOUS
  Filled 2018-11-22 (×4): qty 0.4

## 2018-11-22 MED ORDER — PROPOFOL 1000 MG/100ML IV EMUL
5.0000 ug/kg/min | INTRAVENOUS | Status: DC
  Administered 2018-11-22: 10 ug/kg/min via INTRAVENOUS

## 2018-11-22 MED ORDER — FENTANYL 2500MCG IN NS 250ML (10MCG/ML) PREMIX INFUSION
25.0000 ug/h | INTRAVENOUS | Status: DC
  Administered 2018-11-22: 200 ug/h via INTRAVENOUS
  Administered 2018-11-23 (×2): 400 ug/h via INTRAVENOUS
  Filled 2018-11-22 (×3): qty 250

## 2018-11-22 MED ORDER — ONDANSETRON HCL 4 MG/2ML IJ SOLN: 4.0000 mg | Freq: Four times a day (QID) | INTRAMUSCULAR | Status: DC | PRN

## 2018-11-22 MED ORDER — IOHEXOL 300 MG/ML  SOLN
100.0000 mL | Freq: Once | INTRAMUSCULAR | Status: AC | PRN
  Administered 2018-11-22: 100 mL via INTRAVENOUS

## 2018-11-22 MED ORDER — MIDAZOLAM HCL 5 MG/5ML IJ SOLN
INTRAMUSCULAR | Status: AC | PRN
  Administered 2018-11-22: 5 mg via INTRAVENOUS

## 2018-11-22 MED ORDER — POTASSIUM CHLORIDE IN NACL 20-0.9 MEQ/L-% IV SOLN
INTRAVENOUS | Status: DC
  Administered 2018-11-22 – 2018-11-23 (×2): via INTRAVENOUS
  Filled 2018-11-22 (×2): qty 1000

## 2018-11-22 MED ORDER — ETOMIDATE 2 MG/ML IV SOLN
INTRAVENOUS | Status: AC | PRN
  Administered 2018-11-22: 20 mg via INTRAVENOUS

## 2018-11-22 MED ORDER — ORAL CARE MOUTH RINSE
15.0000 mL | OROMUCOSAL | Status: DC
  Administered 2018-11-22 – 2018-11-23 (×7): 15 mL via OROMUCOSAL

## 2018-11-22 MED ORDER — MIDAZOLAM HCL 2 MG/2ML IJ SOLN
INTRAMUSCULAR | Status: AC
  Filled 2018-11-22: qty 6

## 2018-11-22 MED ORDER — MIDAZOLAM 50MG/50ML (1MG/ML) PREMIX INFUSION
0.0000 mg/h | INTRAVENOUS | Status: DC
  Administered 2018-11-22: 7 mg/h via INTRAVENOUS
  Administered 2018-11-22: 10 mg/h via INTRAVENOUS
  Administered 2018-11-22: 1 mg/h via INTRAVENOUS
  Administered 2018-11-23 (×2): 10 mg/h via INTRAVENOUS
  Filled 2018-11-22 (×5): qty 50

## 2018-11-22 NOTE — Progress Notes (Signed)
   11/22/18 1200  Clinical Encounter Type  Visited With Patient;Health care provider  Visit Type ED  Responded to Page for Level 1 fall. Patient alert and aggressive. No family present. Patient came from jail. Provided the ministry of presence.

## 2018-11-22 NOTE — Progress Notes (Signed)
Orthopedic Tech Progress Note Patient Details:  Jerry Finley 1995/11/26 [id number]  Ortho Devices Type of Ortho Device: Shoulder immobilizer Ortho Device/Splint Location: drop off, Patent in restraints       Saul Fordyce 11/22/2018, 9:06 PM
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
## 2018-11-22 NOTE — ED Provider Notes (Signed)
[place] EMERGENCY DEPARTMENT Provider Note   CSN: [id number] Arrival date & time: 11/22/18  1051    History   Chief Complaint Chief Complaint  Patient presents with   Level 1 head injury    HPI Jerry Finley is a 23 y.o. male.     HPI Patient brought in by EMS after witnessed jump from roughly 12 feet off the ground landing headfirst on a table.  Patient with altered mental status and multiple seizure-like episodes en route.  Unable to contribute to history.  Level 5 caveat applies. 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   HIV (human immunodeficiency virus infection) ([place])    Legal problem 04/09/2016   Nausea with vomiting 06/25/2016   Smoker 11/20/2016    Patient Active Problem List   Diagnosis Date Noted   Multiple trauma 11/22/2018   Acute upper respiratory infection 06/09/2018   Elevated liver enzymes 06/09/2018   Cocaine abuse with cocaine-induced mood disorder ([place]) 04/02/2018   Smoker 11/20/2016   Concussion with loss of consciousness 06/25/2016   Nausea with vomiting 06/25/2016   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 Authorizing Provider  bictegravir-emtricitabine-tenofovir AF (BIKTARVY) 50-200-25 MG TABS tablet Take 1 tablet by mouth daily. 06/09/18   Golden Circle, FNP  methadone (DOLOPHINE) 1 MG/1ML solution Take 90 mg by mouth daily.     [provider]    Family History Family History  Problem Relation Age of Onset   Healthy Mother    Healthy Father     Social History Social History   Tobacco Use   Smoking status: Current Every Day Smoker     Packs/day: 1.00    Types: Cigarettes   Smokeless tobacco: Never Used  Substance Use Topics   Alcohol use: Yes    Comment: socially   Drug use: Not Currently    Types: Cocaine, Marijuana, Methamphetamines     Allergies   Patient has no known allergies.   Review of Systems Review of Systems  Unable to perform ROS: Mental status change     Physical Exam Updated Vital Signs BP 124/70    Pulse 98    Temp 98.2 F (36.8 C) (Axillary)    Resp (!) 22    Ht 5' 7" (1.702 m)    Wt 56.7 kg    SpO2 100%    BMI 19.58 kg/m   Physical Exam Vitals signs and nursing note reviewed.  Constitutional:      Appearance: He is well-developed.  HENT:     Head: Normocephalic.     Comments: Patient with abrasions to his forehead and superficial laceration to the left cheek.  No hemotympanum.  No obvious intraoral injury.    Nose: Nose normal.     Mouth/Throat:     Mouth: Mucous membranes are moist.  Eyes:     Comments: Pupils are 4 mm and sluggish bilaterally  Neck:     Comments: Cervical collar in place Cardiovascular:     Rate and Rhythm: Normal rate and regular rhythm.     Heart sounds: No murmur. No  friction rub. No gallop.   Pulmonary:     Effort: Pulmonary effort is normal. No respiratory distress.     Breath sounds: Normal breath sounds. No stridor. No wheezing, rhonchi or rales.  Chest:     Chest wall: No tenderness.  Abdominal:     General: Bowel sounds are normal.     Palpations: Abdomen is soft.     Tenderness: There is no abdominal tenderness. There is no guarding or rebound.  Musculoskeletal: Normal range of motion.        General: No tenderness.     Comments: No thoracic or lumbar step-offs or deformity.  Full range of motion of all extremities with no obvious joint deformity or effusion.  Distal pulses are 2+.  Skin:    General: Skin is warm and dry.     Capillary Refill: Capillary refill takes less than 2 seconds.     Findings: No erythema or rash.  Neurological:      Comments: Patient is intermittently unresponsive and then combative.  He appears to be moving all of his extremities without focal deficit.  He is not answering questions and at times is resisting exam.      ED Treatments / Results  Labs (all labs ordered are listed, but only abnormal results are displayed) Labs Reviewed  CBC WITH DIFFERENTIAL/PLATELET - Abnormal; Notable for the following components:      Result Value   WBC 12.0 (*)    Abs Immature Granulocytes 0.27 (*)    All other components within normal limits  COMPREHENSIVE METABOLIC PANEL - Abnormal; Notable for the following components:   Glucose, Bld 160 (*)    AST 68 (*)    Total Bilirubin 1.3 (*)    All other components within normal limits  MRSA PCR SCREENING  TRIGLYCERIDES  ETHANOL  RAPID URINE DRUG SCREEN, HOSP PERFORMED  URINALYSIS, ROUTINE W REFLEX MICROSCOPIC  CBC  TRIGLYCERIDES  TYPE AND SCREEN  ABO/RH  PREPARE FRESH FROZEN PLASMA    EKG None  Radiology Ct Head Wo Contrast  Result Date: 11/22/2018 CLINICAL DATA:  Jumped from the second story, landing on a metal table head first. EXAM: CT HEAD WITHOUT CONTRAST CT CERVICAL SPINE WITHOUT CONTRAST TECHNIQUE: Multidetector CT imaging of the head and cervical spine was performed following the standard protocol without intravenous contrast. Multiplanar CT image reconstructions of the cervical spine were also generated. COMPARISON:  None. FINDINGS: CT HEAD FINDINGS Brain: No evidence of acute infarction, hemorrhage, hydrocephalus, extra-axial collection or mass lesion/mass effect. Vascular: No hyperdense vessel or unexpected calcification. Skull: Normal. Negative for fracture or focal lesion. Sinuses/Orbits: No acute finding. Other: None. CT CERVICAL SPINE FINDINGS Alignment: Normal. Skull base and vertebrae: No acute fracture. No primary bone lesion or focal pathologic process. Minimal degenerative changes at the inferior left C6-7 facet. Soft tissues and spinal  canal: No prevertebral fluid or swelling. No visible canal hematoma. Disc levels:  No significant abnormalities. Upper chest: There is a left apical pneumothorax. Fractures are identified in the posteromedial left first rib and the posterior left third rib. There is also a fracture through the mid left clavicle. Other: The patient has ET and NG tubes. Neither the distal tip of the NG tube with the ETT is present on this study. IMPRESSION: 1. There is a small left apical pneumothorax. 2. Fractures of the posterior left first and third ribs. Left clavicular fracture. 3. No acute intracranial abnormalities. 4. No fracture or traumatic malalignment in the cervical spine. Findings were discussed  with the back up trauma surgeon in person. Electronically Signed   By: Dorise Bullion III M.D   On: 11/22/2018 12:29   Ct Chest W Contrast  Result Date: 11/22/2018 CLINICAL DATA:  Pain after trauma. EXAM: CT CHEST, ABDOMEN, AND PELVIS WITH CONTRAST TECHNIQUE: Multidetector CT imaging of the chest, abdomen and pelvis was performed following the standard protocol during bolus administration of intravenous contrast. CONTRAST:  170m OMNIPAQUE IOHEXOL 300 MG/ML  SOLN COMPARISON:  None. FINDINGS: CT CHEST FINDINGS Cardiovascular: The thoracic aorta is nonaneurysmal with no dissection or atherosclerotic change. Central pulmonary arteries are normal. The heart size is normal. Mediastinum/Nodes: The thyroid is normal. The NG tube terminates in the proximal duodenum. The esophagus is otherwise normal. No adenopathy. No effusions. Lungs/Pleura: The ETT terminates in good position. There is debris posterior to the ETT such as on axial image 39. The trachea and mainstem bronchi are otherwise normal. No pneumothorax on the right. There is a small left-sided pneumothorax. Most of the air is near the left apex. There is an oval nodular density in the medial right lower lobe as seen on axial image 84 of series 10. The right lung is  otherwise clear. There are patchy opacities in the left upper lobe which are thought to primarily be acute, likely either infection, inflammation, or aspiration. There is a focus of air within an opacity is seen on series 10, image 57. This may arise within or adjacent to an airway. A similar lucency in the left upper lobe on series 10, image 66 could also arise from or adjacent to an airway. Another similar region is seen on axial image 69 and appears to be associated with an airway. A final region is seen on image 82, also possibly associated with an airway. There is surrounding ground-glass and opacity in each of these regions. No other abnormalities identified in the left lung. Musculoskeletal: There is a fracture in the medial third of the left clavicle. There is a comminuted fracture through the scapula, extending into the glenoid. There are fractures of the posterior first, third, and fifth ribs. There is 5-10% anterior wedging of T12, particularly to the right of the vertebral body. There appears to be a Schmorl's node associated with the superior endplate of T7, likely nonacute. There is 5-10% anterior wedging of the right aspect of T6 which is age indeterminate but may be acute. There is 5-10% anterior wedging of T5 which is also age indeterminate but may be acute. Vertebral bodies are otherwise unremarkable. CT ABDOMEN PELVIS FINDINGS Hepatobiliary: No focal liver abnormality is seen. No gallstones, gallbladder wall thickening, or biliary dilatation. Pancreas: Unremarkable. No pancreatic ductal dilatation or surrounding inflammatory changes. Spleen: Normal in size without focal abnormality. Adrenals/Urinary Tract: Adrenal glands are unremarkable. Kidneys are normal, without renal calculi, focal lesion, or hydronephrosis. Bladder is unremarkable. Stomach/Bowel: Stomach is within normal limits. Appendix appears normal. No evidence of bowel wall thickening, distention, or inflammatory changes.  Vascular/Lymphatic: No significant vascular findings are present. No enlarged abdominal or pelvic lymph nodes. Reproductive: Prostate is unremarkable. Other: No abdominal wall hernia or abnormality. No abdominopelvic ascites. Musculoskeletal: There is a compression fracture of L2 which is acute in appearance. There is approximately 20% loss of height and the fracture appears to be primarily associated with the superior endplate. No extension into the pedicles identified. No retropulsion. The remainder of the lumbar spine is unremarkable. IMPRESSION: 1. Small left-sided pneumothorax. 2. Patchy opacities in the left upper and lower lobes with minimal involvement  in the right lower lobe. This could represent an infectious or inflammatory process. Aspiration is possible. 3. There are left-sided pulmonary opacities with central lucencies which may or may not be associated with distal airways. The opacities with central lucencies could represent an underlying chronic process or sequela of trauma such as contusion. Recommend a short-term follow-up CT scan to assess in the nonacute setting. The opacities with central lucencies are not favored to represent septic emboli given the asymmetry, only located on the left. 4. Fracture of the left clavicle, left scapula, left ribs, in the L2 vertebral body as above. Anterior wedging of T5, T6, and T12 is age indeterminate. Subtle acute fractures in these regions not excluded although the findings may be nonacute at T5, T6, and T12. Findings discussed with Dr. Ninfa Linden. Electronically Signed   By: Dorise Bullion III M.D   On: 11/22/2018 13:07   Ct Cervical Spine Wo Contrast  Result Date: 11/22/2018 CLINICAL DATA:  Jumped from the second story, landing on a metal table head first. EXAM: CT HEAD WITHOUT CONTRAST CT CERVICAL SPINE WITHOUT CONTRAST TECHNIQUE: Multidetector CT imaging of the head and cervical spine was performed following the standard protocol without intravenous  contrast. Multiplanar CT image reconstructions of the cervical spine were also generated. COMPARISON:  None. FINDINGS: CT HEAD FINDINGS Brain: No evidence of acute infarction, hemorrhage, hydrocephalus, extra-axial collection or mass lesion/mass effect. Vascular: No hyperdense vessel or unexpected calcification. Skull: Normal. Negative for fracture or focal lesion. Sinuses/Orbits: No acute finding. Other: None. CT CERVICAL SPINE FINDINGS Alignment: Normal. Skull base and vertebrae: No acute fracture. No primary bone lesion or focal pathologic process. Minimal degenerative changes at the inferior left C6-7 facet. Soft tissues and spinal canal: No prevertebral fluid or swelling. No visible canal hematoma. Disc levels:  No significant abnormalities. Upper chest: There is a left apical pneumothorax. Fractures are identified in the posteromedial left first rib and the posterior left third rib. There is also a fracture through the mid left clavicle. Other: The patient has ET and NG tubes. Neither the distal tip of the NG tube with the ETT is present on this study. IMPRESSION: 1. There is a small left apical pneumothorax. 2. Fractures of the posterior left first and third ribs. Left clavicular fracture. 3. No acute intracranial abnormalities. 4. No fracture or traumatic malalignment in the cervical spine. Findings were discussed with the back up trauma surgeon in person. Electronically Signed   By: Dorise Bullion III M.D   On: 11/22/2018 12:29   Ct Abdomen Pelvis W Contrast  Result Date: 11/22/2018 CLINICAL DATA:  Pain after trauma. EXAM: CT CHEST, ABDOMEN, AND PELVIS WITH CONTRAST TECHNIQUE: Multidetector CT imaging of the chest, abdomen and pelvis was performed following the standard protocol during bolus administration of intravenous contrast. CONTRAST:  133m OMNIPAQUE IOHEXOL 300 MG/ML  SOLN COMPARISON:  None. FINDINGS: CT CHEST FINDINGS Cardiovascular: The thoracic aorta is nonaneurysmal with no dissection or  atherosclerotic change. Central pulmonary arteries are normal. The heart size is normal. Mediastinum/Nodes: The thyroid is normal. The NG tube terminates in the proximal duodenum. The esophagus is otherwise normal. No adenopathy. No effusions. Lungs/Pleura: The ETT terminates in good position. There is debris posterior to the ETT such as on axial image 39. The trachea and mainstem bronchi are otherwise normal. No pneumothorax on the right. There is a small left-sided pneumothorax. Most of the air is near the left apex. There is an oval nodular density in the medial right lower lobe as seen  on axial image 84 of series 10. The right lung is otherwise clear. There are patchy opacities in the left upper lobe which are thought to primarily be acute, likely either infection, inflammation, or aspiration. There is a focus of air within an opacity is seen on series 10, image 57. This may arise within or adjacent to an airway. A similar lucency in the left upper lobe on series 10, image 66 could also arise from or adjacent to an airway. Another similar region is seen on axial image 69 and appears to be associated with an airway. A final region is seen on image 82, also possibly associated with an airway. There is surrounding ground-glass and opacity in each of these regions. No other abnormalities identified in the left lung. Musculoskeletal: There is a fracture in the medial third of the left clavicle. There is a comminuted fracture through the scapula, extending into the glenoid. There are fractures of the posterior first, third, and fifth ribs. There is 5-10% anterior wedging of T12, particularly to the right of the vertebral body. There appears to be a Schmorl's node associated with the superior endplate of T7, likely nonacute. There is 5-10% anterior wedging of the right aspect of T6 which is age indeterminate but may be acute. There is 5-10% anterior wedging of T5 which is also age indeterminate but may be acute.  Vertebral bodies are otherwise unremarkable. CT ABDOMEN PELVIS FINDINGS Hepatobiliary: No focal liver abnormality is seen. No gallstones, gallbladder wall thickening, or biliary dilatation. Pancreas: Unremarkable. No pancreatic ductal dilatation or surrounding inflammatory changes. Spleen: Normal in size without focal abnormality. Adrenals/Urinary Tract: Adrenal glands are unremarkable. Kidneys are normal, without renal calculi, focal lesion, or hydronephrosis. Bladder is unremarkable. Stomach/Bowel: Stomach is within normal limits. Appendix appears normal. No evidence of bowel wall thickening, distention, or inflammatory changes. Vascular/Lymphatic: No significant vascular findings are present. No enlarged abdominal or pelvic lymph nodes. Reproductive: Prostate is unremarkable. Other: No abdominal wall hernia or abnormality. No abdominopelvic ascites. Musculoskeletal: There is a compression fracture of L2 which is acute in appearance. There is approximately 20% loss of height and the fracture appears to be primarily associated with the superior endplate. No extension into the pedicles identified. No retropulsion. The remainder of the lumbar spine is unremarkable. IMPRESSION: 1. Small left-sided pneumothorax. 2. Patchy opacities in the left upper and lower lobes with minimal involvement in the right lower lobe. This could represent an infectious or inflammatory process. Aspiration is possible. 3. There are left-sided pulmonary opacities with central lucencies which may or may not be associated with distal airways. The opacities with central lucencies could represent an underlying chronic process or sequela of trauma such as contusion. Recommend a short-term follow-up CT scan to assess in the nonacute setting. The opacities with central lucencies are not favored to represent septic emboli given the asymmetry, only located on the left. 4. Fracture of the left clavicle, left scapula, left ribs, in the L2 vertebral body  as above. Anterior wedging of T5, T6, and T12 is age indeterminate. Subtle acute fractures in these regions not excluded although the findings may be nonacute at T5, T6, and T12. Findings discussed with Dr. Ninfa Linden. Electronically Signed   By: Dorise Bullion III M.D   On: 11/22/2018 13:07    Procedures Procedure Name: Intubation Date/Time: 11/22/2018 11:32 AM Performed by: Julianne Rice, MD Pre-anesthesia Checklist: Patient identified, Patient being monitored, Emergency Drugs available, Timeout performed and Suction available Oxygen Delivery Method: Non-rebreather mask Preoxygenation: Pre-oxygenation with 100%  oxygen Induction Type: Rapid sequence Ventilation: Mask ventilation without difficulty Laryngoscope Size: Glidescope Tube size: 7.5 mm Number of attempts: 1 Placement Confirmation: ETT inserted through vocal cords under direct vision,  CO2 detector and Breath sounds checked- equal and bilateral Tube secured with: ETT holder      (including critical care time)  Medications Ordered in ED Medications  propofol (DIPRIVAN) 1000 MG/100ML infusion (  Not Given 11/22/18 1200)  enoxaparin (LOVENOX) injection 40 mg (has no administration in time range)  0.9 % NaCl with KCl 20 mEq/ L  infusion (has no administration in time range)  ondansetron (ZOFRAN-ODT) disintegrating tablet 4 mg (has no administration in time range)    Or  ondansetron (ZOFRAN) injection 4 mg (has no administration in time range)  propofol (DIPRIVAN) 1000 MG/100ML infusion (has no administration in time range)  fentaNYL (SUBLIMAZE) injection 50 mcg (has no administration in time range)  fentaNYL 2526mg in NS 2575m(101mml) infusion-PREMIX (200 mcg/hr Intravenous New Bag/Given 11/22/18 1421)  fentaNYL (SUBLIMAZE) bolus via infusion 50 mcg (has no administration in time range)  propofol (DIPRIVAN) 1000 MG/100ML infusion (  Not Given 11/22/18 1400)  chlorhexidine gluconate (MEDLINE KIT) (PERIDEX) 0.12 % solution 15  mL (15 mLs Mouth Rinse Given 11/22/18 1425)  MEDLINE mouth rinse (has no administration in time range)  fentaNYL (SUBLIMAZE) bolus via infusion 30 mcg (has no administration in time range)  midazolam (VERSED) 3m39m NS 3mL49mg/m20mpremix infusion (1 mg/hr Intravenous New Bag/Given 11/22/18 1425)  iohexol (OMNIPAQUE) 300 MG/ML solution 100 mL (100 mLs Intravenous Contrast Given 11/22/18 1130)  etomidate (AMIDATE) injection (20 mg Intravenous Given 11/22/18 1101)  succinylcholine (ANECTINE) injection (100 mg Intravenous Given 11/22/18 1101)  midazolam (VERSED) 5 MG/5ML injection ( Intravenous Canceled Entry 11/22/18 1130)  midazolam (VERSED) 2 MG/2ML injection (2 mg  Given 11/22/18 1402)    CRITICAL CARE Performed by: Jozelynn Danielson Julianne Rice critical care time:40 minutes Critical care time was exclusive of separately billable procedures and treating other patients. Critical care was necessary to treat or prevent imminent or life-threatening deterioration. Critical care was time spent personally by me on the following activities: development of treatment plan with patient and/or surrogate as well as nursing, discussions with consultants, evaluation of patient's response to treatment, examination of patient, obtaining history from patient or surrogate, ordering and performing treatments and interventions, ordering and review of laboratory studies, ordering and review of radiographic studies, pulse oximetry and re-evaluation of patient's condition.  Initial Impression / Assessment and Plan / ED Course  I have reviewed the triage vital signs and the nursing notes.  Pertinent labs & imaging results that were available during my care of the patient were reviewed by me and considered in my medical decision making (see chart for details).       Level 1 trauma activated. Intubated due to head trauma and altered mental status to facilitate further work-up.  No definite seizure-like activity while in the  emergency department.  Final Clinical Impressions(s) / ED Diagnoses   Final diagnoses:  Closed head injury, initial encounter  [facility]) [facility]    None       YelverJulianne Rice3/14/20 1500

## 2018-11-22 NOTE — Plan of Care (Signed)
  Problem: Education: Goal: Knowledge of General Education information will improve Description Including pain rating scale, medication(s)/side effects and non-pharmacologic comfort measures Outcome: Not Progressing   Problem: Clinical Measurements: Goal: Will remain free from infection Outcome: Not Progressing Goal: Respiratory complications will improve Outcome: Progressing   Problem: Pain Managment: Goal: General experience of comfort will improve Outcome: Not Progressing

## 2018-11-22 NOTE — ED Notes (Signed)
Dr Ranae Palms gave verbal order for soft restraints to wrists. Pt pulling at tube.

## 2018-11-22 NOTE — Consult Note (Signed)
Reason for Consult: Left intra-articular scapular fracture, left clavicle fracture Referring Physician: Dr. Phylliss Blakes Jerry Finley is an 23 y.o. male.  HPI: Patient jumped from a height and landed on his head and left shoulder.  He was brought to the emergency department by the prison guards.  He was intubated and sedated on arrival.  He was found to have a left intra-articular scapular fracture and left clavicle fracture.  Orthopedics was consulted.  On my evaluation patient is intubated and sedated.  He has some swelling and deformity but the left shoulder and clavicle area without any skin tenting.  CT scan demonstrated intra-articular scapular fracture and clavicle fracture.  I am unable to get history due to intubated and sedated status.  Past Medical History:  Diagnosis Date  . Concussion with loss of consciousness 06/25/2016  . 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 HIV (human immunodeficiency virus infection) (HCC)   . Legal problem 04/09/2016  . Nausea with vomiting 06/25/2016  . Smoker 11/20/2016    Past Surgical History:  Procedure Laterality Date  . HAND SURGERY      Family History  Problem Relation Age of Onset  . Healthy Mother   . Healthy Father     Social History:  reports that he has been smoking cigarettes. He has been smoking about 1.00 pack per day. He has never used smokeless tobacco. He reports current alcohol use. He reports previous drug use. Drugs: Cocaine, Marijuana, and Methamphetamines.  Allergies: No Known Allergies  Medications: I have reviewed the patient's current medications.  Results for orders placed or performed during the hospital encounter of 11/22/18 (from the past 48 hour(s))  Type and screen Ordered by PROVIDER DEFAULT     Status: None   Collection Time: 11/22/18 10:59 AM  Result Value Ref Range   ABO/RH(D) O NEG    Antibody Screen NEG    Sample Expiration 11/25/2018    Unit Number [id number]    Blood Component Type  RED CELLS,LR    Unit division 00    Status of Unit REL FROM [facility]    Unit tag comment EMERGENCY RELEASE    Transfusion Status      OK TO TRANSFUSE Performed at [facility], [address], [place], [place] [address]    Crossmatch Result PENDING    Unit Number [id number]    Blood Component Type RED CELLS,LR    Unit division 00    Status of Unit REL FROM [facility]    Unit tag comment EMERGENCY RELEASE    Transfusion Status OK TO TRANSFUSE    Crossmatch Result PENDING   ABO/Rh     Status: None (Preliminary result)   Collection Time: 11/22/18 10:59 AM  Result Value Ref Range   ABO/RH(D)      O NEG Performed at [facility], [address] [place], [place] [address]   Triglycerides     Status: None   Collection Time: 11/22/18 11:14 AM  Result Value Ref Range   Triglycerides 77 <150 mg/dL    Comment: Performed at [facility], [address], [place], [place] [address]  CBC with Differential/Platelet     Status: Abnormal   Collection Time: 11/22/18 11:24 AM  Result Value Ref Range   WBC 12.0 (H) 4.0 - 10.5 K/uL   RBC 4.80 4.22 - 5.81 MIL/uL   Hemoglobin 14.3 13.0 - 17.0 g/dL   HCT 15.9 45.8 - 59.2 %   MCV 91.0  80.0 - 100.0 fL   MCH 29.8 26.0 - 34.0 pg   MCHC 32.7 30.0 - 36.0 g/dL   RDW 16.1 09.6 - 04.5 %   Platelets 354 150 - 400 K/uL   nRBC 0.0 0.0 - 0.2 %   Neutrophils Relative % 63 %   Neutro Abs 7.6 1.7 - 7.7 K/uL   Lymphocytes Relative 28 %   Lymphs Abs 3.4 0.7 - 4.0 K/uL   Monocytes Relative 6 %   Monocytes Absolute 0.8 0.1 - 1.0 K/uL   Eosinophils Relative 1 %   Eosinophils Absolute 0.1 0.0 - 0.5 K/uL   Basophils Relative 0 %   Basophils Absolute 0.0 0.0 - 0.1 K/uL   Immature Granulocytes 2 %   Abs Immature Granulocytes 0.27 (H) 0.00 - 0.07 K/uL    Comment: Performed at [facility], [address], [place], [place] [address]  Comprehensive metabolic panel     Status: Abnormal   Collection Time: 11/22/18 11:24 AM  Result Value Ref  Range   Sodium 136 135 - 145 mmol/L   Potassium 3.8 3.5 - 5.1 mmol/L   Chloride 103 98 - 111 mmol/L   CO2 22 22 - 32 mmol/L   Glucose, Bld 160 (H) 70 - 99 mg/dL   BUN 12 6 - 20 mg/dL   Creatinine, Ser 1.91 0.61 - 1.24 mg/dL   Calcium 9.2 8.9 - 47.8 mg/dL   Total Protein 7.4 6.5 - 8.1 g/dL   Albumin 4.2 3.5 - 5.0 g/dL   AST 68 (H) 15 - 41 U/L   ALT 27 0 - 44 U/L   Alkaline Phosphatase 89 38 - 126 U/L   Total Bilirubin 1.3 (H) 0.3 - 1.2 mg/dL   GFR calc non Af Amer >60 >60 mL/min   GFR calc Af Amer >60 >60 mL/min   Anion gap 11 5 - 15    Comment: Performed at [facility], [address], [place], [place] [address]  Ethanol     Status: None   Collection Time: 11/22/18 11:24 AM  Result Value Ref Range   Alcohol, Ethyl (B) <10 <10 mg/dL    Comment: (NOTE) Lowest detectable limit for serum alcohol is 10 mg/dL. For medical purposes only. Performed at [facility], [address], [place], [place] [address]     Ct Head Wo Contrast  Result Date: 11/22/2018 CLINICAL DATA:  Jumped from the second story, landing on a metal table head first. EXAM: CT HEAD WITHOUT CONTRAST CT CERVICAL SPINE WITHOUT CONTRAST TECHNIQUE: Multidetector CT imaging of the head and cervical spine was performed following the standard protocol without intravenous contrast. Multiplanar CT image reconstructions of the cervical spine were also generated. COMPARISON:  None. FINDINGS: CT HEAD FINDINGS Brain: No evidence of acute infarction, hemorrhage, hydrocephalus, extra-axial collection or mass lesion/mass effect. Vascular: No hyperdense vessel or unexpected calcification. Skull: Normal. Negative for fracture or focal lesion. Sinuses/Orbits: No acute finding. Other: None. CT CERVICAL SPINE FINDINGS Alignment: Normal. Skull base and vertebrae: No acute fracture. No primary bone lesion or focal pathologic process. Minimal degenerative changes at the inferior left C6-7 facet. Soft tissues and spinal canal: No  prevertebral fluid or swelling. No visible canal hematoma. Disc levels:  No significant abnormalities. Upper chest: There is a left apical pneumothorax. Fractures are identified in the posteromedial left first rib and the posterior left third rib. There is also a fracture through the mid left clavicle. Other: The patient has ET and NG  tubes. Neither the distal tip of the NG tube with the ETT is present on this study. IMPRESSION: 1. There is a small left apical pneumothorax. 2. Fractures of the posterior left first and third ribs. Left clavicular fracture. 3. No acute intracranial abnormalities. 4. No fracture or traumatic malalignment in the cervical spine. Findings were discussed with the back up trauma surgeon in person. Electronically Signed   By: Gerome Sam III M.D   On: 11/22/2018 12:29   Ct Cervical Spine Wo Contrast  Result Date: 11/22/2018 CLINICAL DATA:  Jumped from the second story, landing on a metal table head first. EXAM: CT HEAD WITHOUT CONTRAST CT CERVICAL SPINE WITHOUT CONTRAST TECHNIQUE: Multidetector CT imaging of the head and cervical spine was performed following the standard protocol without intravenous contrast. Multiplanar CT image reconstructions of the cervical spine were also generated. COMPARISON:  None. FINDINGS: CT HEAD FINDINGS Brain: No evidence of acute infarction, hemorrhage, hydrocephalus, extra-axial collection or mass lesion/mass effect. Vascular: No hyperdense vessel or unexpected calcification. Skull: Normal. Negative for fracture or focal lesion. Sinuses/Orbits: No acute finding. Other: None. CT CERVICAL SPINE FINDINGS Alignment: Normal. Skull base and vertebrae: No acute fracture. No primary bone lesion or focal pathologic process. Minimal degenerative changes at the inferior left C6-7 facet. Soft tissues and spinal canal: No prevertebral fluid or swelling. No visible canal hematoma. Disc levels:  No significant abnormalities. Upper chest: There is a left apical  pneumothorax. Fractures are identified in the posteromedial left first rib and the posterior left third rib. There is also a fracture through the mid left clavicle. Other: The patient has ET and NG tubes. Neither the distal tip of the NG tube with the ETT is present on this study. IMPRESSION: 1. There is a small left apical pneumothorax. 2. Fractures of the posterior left first and third ribs. Left clavicular fracture. 3. No acute intracranial abnormalities. 4. No fracture or traumatic malalignment in the cervical spine. Findings were discussed with the back up trauma surgeon in person. Electronically Signed   By: Gerome Sam III M.D   On: 11/22/2018 12:29    Review of Systems  Unable to perform ROS: Intubated   Blood pressure 124/70, pulse 86, temperature 98.2 F (36.8 C), temperature source Axillary, resp. rate (!) 23, height 5\' 7"  (1.702 m), weight 56.7 kg, SpO2 100 %. Physical Exam  Constitutional: He appears well-developed.  HENT:  Facial abrasions  Eyes:  Unable to assess due to intubation and patient status  Neck:  In cervical collar  Cardiovascular: Normal rate.  Respiratory:  Patient is ventilated with endotracheal tube  GI: Soft.  Musculoskeletal:     Comments: Left upper extremity demonstrates some swelling and deformity about the left shoulder.  There is no skin tenting.  Patient is in restraints and unable to range the shoulder due to intubated status.  No crepitance to palpation but the arm, elbow, forearm, wrist or hand.  No open wounds notable.  No evidence of injury to right upper extremity or bilateral lower extremities.  No crepitance to palpation of each extremity.  No notable swelling or abrasions.  No instability noted with pelvis compression AP and laterally.  Neurological:  Patient is intubated and sedated  Skin: Skin is warm.  Psychiatric:  Unable to assess due to intubated and sedated status.    Assessment/Plan: Based on the CT scan we will order x-rays  of the left shoulder and left clavicle.  Based on the x-ray findings will likely plan to treat this  nonoperatively with sling immobilization.  We will order a sling for comfort.  Currently he is in four-point restraints.  [facility].  Will follow while in house and attempt to get a tertiary exam once he is more awake.  Terance Hart 11/22/2018, 1:07 PM

## 2018-11-22 NOTE — Progress Notes (Signed)
Patient ID: Jerry Finley, male   DOB: June 09, 1996, 23 y.o.   MRN: [id number]  CT scans finished Reviewed with radiology. CT head and C-spine are negative  So far, the following injuries have been found:  Left clavicle fracture Left scapula fracture Multiple left rib fractures Tiny apical left pneumothorax Possible Left lung chronic pulmonary process.  No obvious solid organ injury.

## 2018-11-22 NOTE — Progress Notes (Signed)
Orthopedic Tech Progress Note Patient Details:  Jerry Finley July 01, 1996 [id number] Level 1 trauma Patient ID: Jerry Finley, male   DOB: 17-Oct-1995, 23 y.o.   MRN: [id number]   Donald Pore 11/22/2018, 12:18 PM

## 2018-11-22 NOTE — Consult Note (Signed)
Reason for Consult: spine fractures Referring Physician: Dr. Marilynne Driversamirez  Jerry Finley is an 23 y.o. male.   HPI:  23 year old admitted to the ICU after jumping from a second story onto a metal table head first in a jail. He has a history of HIV, polysubstance abuse, depression and suicidal ideation. Apparently patient was in and out of consciousness in the ED which necessitated intubation. He was very combative and uncooperative. Upon assessment in the ICU he was thrashing in bed and moving all extremities.   Past Medical History:  Diagnosis Date  . Concussion with loss of consciousness 06/25/2016  . 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 HIV (human immunodeficiency virus infection) (HCC)   . Legal problem 04/09/2016  . Nausea with vomiting 06/25/2016  . Smoker 11/20/2016    Past Surgical History:  Procedure Laterality Date  . HAND SURGERY      No Known Allergies  Social History   Tobacco Use  . Smoking status: Current Every Day Smoker    Packs/day: 1.00    Types: Cigarettes  . Smokeless tobacco: Never Used  Substance Use Topics  . Alcohol use: Yes    Comment: socially    Family History  Problem Relation Age of Onset  . Healthy Mother   . Healthy Father      Review of Systems  Positive ROS: intubated and sedated  All other systems have been reviewed and were otherwise negative with the exception of those mentioned in the HPI and as above.  Objective: Vital signs in last 24 hours: Temp:  [97.8 F (36.6 C)-99.7 F (37.6 C)] 99.7 F (37.6 C) (03/14 1600) Pulse Rate:  [68-104] 98 (03/14 1600) Resp:  [13-34] 24 (03/14 1600) BP: (112-178)/(66-126) 121/108 (03/14 1600) SpO2:  [91 %-100 %] 91 % (03/14 1600) FiO2 (%):  [50 %-100 %] 50 % (03/14 1513) Weight:  [56.7 kg-70 kg] 56.7 kg (03/14 1252)  General Appearance: Alert, noncooperative, appears stated age, intubated and sedated Head: Normocephalic, without obvious abnormality, atraumatic Eyes: PERRL,  conjunctiva/corneas clear, EOM's intact, fundi benign, both eyes      Throat: ETT Lungs: respirations unlabored Heart: tachycardic  NEUROLOGIC:   Mental status:intubated and sedated  Motor Exam - grossly normal, normal tone and bulk Sensory Exam - Unable to test Reflexes: symmetric, no pathologic reflexes, No Hoffman's, No clonus Coordination - unable to test Gait - unable to test Balance - unable to test Cranial Nerves: I: smell Not tested  II: visual acuity  OS: na  OD: na  II: visual fields Full to confrontation  II: pupils Equal, round, reactive to light  III,VII: ptosis None  III,IV,VI: extraocular muscles  Full ROM  V: mastication   V: facial light touch sensation    V,VII: corneal reflex    VII: facial muscle function - upper    VII: facial muscle function - lower   VIII: hearing Not tested  IX: soft palate elevation    IX,X: gag reflex Present  XI: trapezius strength    XI: sternocleidomastoid strength   XI: neck flexion strength    XII: tongue strength      Data Review Lab Results  Component Value Date   WBC 12.0 (H) 11/22/2018   HGB 14.3 11/22/2018   HCT 43.7 11/22/2018   MCV 91.0 11/22/2018   PLT 354 11/22/2018   Lab Results  Component Value Date   NA 136 11/22/2018   K 3.8 11/22/2018   CL 103 11/22/2018  CO2 22 11/22/2018   BUN 12 11/22/2018   CREATININE 0.86 11/22/2018   GLUCOSE 160 (H) 11/22/2018   No results found for: INR, PROTIME  Radiology: Dg Clavicle Left  Result Date: 11/22/2018 CLINICAL DATA:  Acute LEFT clavicle pain following injury. Initial encounter. EXAM: LEFT CLAVICLE - 2+ VIEWS COMPARISON:  None. FINDINGS: A vertical fracture of the proximal clavicle is noted with 5 mm INFERIOR displacement. A minimally comminuted fracture of the distal clavicle is noted with 3 mm SUPERIOR displacement. A mildly displaced fracture of the scapula extending into the glenohumeral joint is noted. A fracture of the posteromedial LEFT 3rd rib is  present. [facility] joint appears unremarkable. IMPRESSION: Proximal and distal LEFT clavicle fractures as described. UPPER scapular fracture extending to the glenohumeral joint. Consider CT for further characterization. LEFT 3rd rib fracture. Electronically Signed   By: Harmon Pier M.D.   On: 11/22/2018 16:27   Ct Head Wo Contrast  Result Date: 11/22/2018 CLINICAL DATA:  Jumped from the second story, landing on a metal table head first. EXAM: CT HEAD WITHOUT CONTRAST CT CERVICAL SPINE WITHOUT CONTRAST TECHNIQUE: Multidetector CT imaging of the head and cervical spine was performed following the standard protocol without intravenous contrast. Multiplanar CT image reconstructions of the cervical spine were also generated. COMPARISON:  None. FINDINGS: CT HEAD FINDINGS Brain: No evidence of acute infarction, hemorrhage, hydrocephalus, extra-axial collection or mass lesion/mass effect. Vascular: No hyperdense vessel or unexpected calcification. Skull: Normal. Negative for fracture or focal lesion. Sinuses/Orbits: No acute finding. Other: None. CT CERVICAL SPINE FINDINGS Alignment: Normal. Skull base and vertebrae: No acute fracture. No primary bone lesion or focal pathologic process. Minimal degenerative changes at the inferior left C6-7 facet. Soft tissues and spinal canal: No prevertebral fluid or swelling. No visible canal hematoma. Disc levels:  No significant abnormalities. Upper chest: There is a left apical pneumothorax. Fractures are identified in the posteromedial left first rib and the posterior left third rib. There is also a fracture through the mid left clavicle. Other: The patient has ET and NG tubes. Neither the distal tip of the NG tube with the ETT is present on this study. IMPRESSION: 1. There is a small left apical pneumothorax. 2. Fractures of the posterior left first and third ribs. Left clavicular fracture. 3. No acute intracranial abnormalities. 4. No fracture or traumatic malalignment in the  cervical spine. Findings were discussed with the back up trauma surgeon in person. Electronically Signed   By: Gerome Sam III M.D   On: 11/22/2018 12:29   Ct Chest W Contrast  Result Date: 11/22/2018 CLINICAL DATA:  Pain after trauma. EXAM: CT CHEST, ABDOMEN, AND PELVIS WITH CONTRAST TECHNIQUE: Multidetector CT imaging of the chest, abdomen and pelvis was performed following the standard protocol during bolus administration of intravenous contrast. CONTRAST:  OMNIPAQUE IOHEXOL 300 MG/ML  SOLN COMPARISON:  None. FINDINGS: CT CHEST FINDINGS Cardiovascular: The thoracic aorta is nonaneurysmal with no dissection or atherosclerotic change. Central pulmonary arteries are normal. The heart size is normal. Mediastinum/Nodes: The thyroid is normal. The NG tube terminates in the proximal duodenum. The esophagus is otherwise normal. No adenopathy. No effusions. Lungs/Pleura: The ETT terminates in good position. There is debris posterior to the ETT such as on axial image 39. The trachea and mainstem bronchi are otherwise normal. No pneumothorax on the right. There is a small left-sided pneumothorax. Most of the air is near the left apex. There is an oval nodular density in the medial right lower lobe  as seen on axial image 84 of series 10. The right lung is otherwise clear. There are patchy opacities in the left upper lobe which are thought to primarily be acute, likely either infection, inflammation, or aspiration. There is a focus of air within an opacity is seen on series 10, image 57. This may arise within or adjacent to an airway. A similar lucency in the left upper lobe on series 10, image 66 could also arise from or adjacent to an airway. Another similar region is seen on axial image 69 and appears to be associated with an airway. A final region is seen on image 82, also possibly associated with an airway. There is surrounding ground-glass and opacity in each of these regions. No other abnormalities  identified in the left lung. Musculoskeletal: There is a fracture in the medial third of the left clavicle. There is a comminuted fracture through the scapula, extending into the glenoid. There are fractures of the posterior first, third, and fifth ribs. There is 5-10% anterior wedging of T12, particularly to the right of the vertebral body. There appears to be a Schmorl's node associated with the superior endplate of T7, likely nonacute. There is 5-10% anterior wedging of the right aspect of T6 which is age indeterminate but may be acute. There is 5-10% anterior wedging of T5 which is also age indeterminate but may be acute. Vertebral bodies are otherwise unremarkable. CT ABDOMEN PELVIS FINDINGS Hepatobiliary: No focal liver abnormality is seen. No gallstones, gallbladder wall thickening, or biliary dilatation. Pancreas: Unremarkable. No pancreatic ductal dilatation or surrounding inflammatory changes. Spleen: Normal in size without focal abnormality. Adrenals/Urinary Tract: Adrenal glands are unremarkable. Kidneys are normal, without renal calculi, focal lesion, or hydronephrosis. Bladder is unremarkable. Stomach/Bowel: Stomach is within normal limits. Appendix appears normal. No evidence of bowel wall thickening, distention, or inflammatory changes. Vascular/Lymphatic: No significant vascular findings are present. No enlarged abdominal or pelvic lymph nodes. Reproductive: Prostate is unremarkable. Other: No abdominal wall hernia or abnormality. No abdominopelvic ascites. Musculoskeletal: There is a compression fracture of L2 which is acute in appearance. There is approximately 20% loss of height and the fracture appears to be primarily associated with the superior endplate. No extension into the pedicles identified. No retropulsion. The remainder of the lumbar spine is unremarkable. IMPRESSION: 1. Small left-sided pneumothorax. 2. Patchy opacities in the left upper and lower lobes with minimal involvement in the  right lower lobe. This could represent an infectious or inflammatory process. Aspiration is possible. 3. There are left-sided pulmonary opacities with central lucencies which may or may not be associated with distal airways. The opacities with central lucencies could represent an underlying chronic process or sequela of trauma such as contusion. Recommend a short-term follow-up CT scan to assess in the nonacute setting. The opacities with central lucencies are not favored to represent septic emboli given the asymmetry, only located on the left. 4. Fracture of the left clavicle, left scapula, left ribs, in the L2 vertebral body as above. Anterior wedging of T5, T6, and T12 is age indeterminate. Subtle acute fractures in these regions not excluded although the findings may be nonacute at T5, T6, and T12. Findings discussed with Dr. Magnus Ivan. Electronically Signed   By: Gerome Sam III M.D   On: 11/22/2018 13:07   Ct Cervical Spine Wo Contrast  Result Date: 11/22/2018 CLINICAL DATA:  Jumped from the second story, landing on a metal table head first. EXAM: CT HEAD WITHOUT CONTRAST CT CERVICAL SPINE WITHOUT CONTRAST TECHNIQUE: Multidetector  CT imaging of the head and cervical spine was performed following the standard protocol without intravenous contrast. Multiplanar CT image reconstructions of the cervical spine were also generated. COMPARISON:  None. FINDINGS: CT HEAD FINDINGS Brain: No evidence of acute infarction, hemorrhage, hydrocephalus, extra-axial collection or mass lesion/mass effect. Vascular: No hyperdense vessel or unexpected calcification. Skull: Normal. Negative for fracture or focal lesion. Sinuses/Orbits: No acute finding. Other: None. CT CERVICAL SPINE FINDINGS Alignment: Normal. Skull base and vertebrae: No acute fracture. No primary bone lesion or focal pathologic process. Minimal degenerative changes at the inferior left C6-7 facet. Soft tissues and spinal canal: No prevertebral fluid or  swelling. No visible canal hematoma. Disc levels:  No significant abnormalities. Upper chest: There is a left apical pneumothorax. Fractures are identified in the posteromedial left first rib and the posterior left third rib. There is also a fracture through the mid left clavicle. Other: The patient has ET and NG tubes. Neither the distal tip of the NG tube with the ETT is present on this study. IMPRESSION: 1. There is a small left apical pneumothorax. 2. Fractures of the posterior left first and third ribs. Left clavicular fracture. 3. No acute intracranial abnormalities. 4. No fracture or traumatic malalignment in the cervical spine. Findings were discussed with the back up trauma surgeon in person. Electronically Signed   By: Gerome Sam III M.D   On: 11/22/2018 12:29   Ct Abdomen Pelvis W Contrast  Result Date: 11/22/2018 CLINICAL DATA:  Pain after trauma. EXAM: CT CHEST, ABDOMEN, AND PELVIS WITH CONTRAST TECHNIQUE: Multidetector CT imaging of the chest, abdomen and pelvis was performed following the standard protocol during bolus administration of intravenous contrast. CONTRAST:  OMNIPAQUE IOHEXOL 300 MG/ML  SOLN COMPARISON:  None. FINDINGS: CT CHEST FINDINGS Cardiovascular: The thoracic aorta is nonaneurysmal with no dissection or atherosclerotic change. Central pulmonary arteries are normal. The heart size is normal. Mediastinum/Nodes: The thyroid is normal. The NG tube terminates in the proximal duodenum. The esophagus is otherwise normal. No adenopathy. No effusions. Lungs/Pleura: The ETT terminates in good position. There is debris posterior to the ETT such as on axial image 39. The trachea and mainstem bronchi are otherwise normal. No pneumothorax on the right. There is a small left-sided pneumothorax. Most of the air is near the left apex. There is an oval nodular density in the medial right lower lobe as seen on axial image 84 of series 10. The right lung is otherwise clear. There are  patchy opacities in the left upper lobe which are thought to primarily be acute, likely either infection, inflammation, or aspiration. There is a focus of air within an opacity is seen on series 10, image 57. This may arise within or adjacent to an airway. A similar lucency in the left upper lobe on series 10, image 66 could also arise from or adjacent to an airway. Another similar region is seen on axial image 69 and appears to be associated with an airway. A final region is seen on image 82, also possibly associated with an airway. There is surrounding ground-glass and opacity in each of these regions. No other abnormalities identified in the left lung. Musculoskeletal: There is a fracture in the medial third of the left clavicle. There is a comminuted fracture through the scapula, extending into the glenoid. There are fractures of the posterior first, third, and fifth ribs. There is 5-10% anterior wedging of T12, particularly to the right of the vertebral body. There appears to be a Schmorl's  node associated with the superior endplate of T7, likely nonacute. There is 5-10% anterior wedging of the right aspect of T6 which is age indeterminate but may be acute. There is 5-10% anterior wedging of T5 which is also age indeterminate but may be acute. Vertebral bodies are otherwise unremarkable. CT ABDOMEN PELVIS FINDINGS Hepatobiliary: No focal liver abnormality is seen. No gallstones, gallbladder wall thickening, or biliary dilatation. Pancreas: Unremarkable. No pancreatic ductal dilatation or surrounding inflammatory changes. Spleen: Normal in size without focal abnormality. Adrenals/Urinary Tract: Adrenal glands are unremarkable. Kidneys are normal, without renal calculi, focal lesion, or hydronephrosis. Bladder is unremarkable. Stomach/Bowel: Stomach is within normal limits. Appendix appears normal. No evidence of bowel wall thickening, distention, or inflammatory changes. Vascular/Lymphatic: No significant  vascular findings are present. No enlarged abdominal or pelvic lymph nodes. Reproductive: Prostate is unremarkable. Other: No abdominal wall hernia or abnormality. No abdominopelvic ascites. Musculoskeletal: There is a compression fracture of L2 which is acute in appearance. There is approximately 20% loss of height and the fracture appears to be primarily associated with the superior endplate. No extension into the pedicles identified. No retropulsion. The remainder of the lumbar spine is unremarkable. IMPRESSION: 1. Small left-sided pneumothorax. 2. Patchy opacities in the left upper and lower lobes with minimal involvement in the right lower lobe. This could represent an infectious or inflammatory process. Aspiration is possible. 3. There are left-sided pulmonary opacities with central lucencies which may or may not be associated with distal airways. The opacities with central lucencies could represent an underlying chronic process or sequela of trauma such as contusion. Recommend a short-term follow-up CT scan to assess in the nonacute setting. The opacities with central lucencies are not favored to represent septic emboli given the asymmetry, only located on the left. 4. Fracture of the left clavicle, left scapula, left ribs, in the L2 vertebral body as above. Anterior wedging of T5, T6, and T12 is age indeterminate. Subtle acute fractures in these regions not excluded although the findings may be nonacute at T5, T6, and T12. Findings discussed with Dr. Magnus Ivan. Electronically Signed   By: Gerome Sam III M.D   On: 11/22/2018 13:07   Dg Shoulder Left  Result Date: 11/22/2018 CLINICAL DATA:  Pain following fall EXAM: LEFT SHOULDER - 2+ VIEW COMPARISON:  None. FINDINGS: Frontal, oblique, and Y scapular views were obtained. There is a comminuted fracture of the lateral left clavicle with overall alignment near anatomic. No other fracture in the shoulder region noted. No dislocation. There is a fracture of  the posterior left third rib. No appreciable joint space narrowing or erosion. Visualized left lung clear. In particular, no evident pneumothorax in visualized left lung region. IMPRESSION: 1. Comminuted fracture lateral left clavicle with overall alignment near anatomic. No dislocation or shoulder arthropathy. 2. Fracture posterior left third rib with displacement. No pneumothorax evident on the left in the visualized regions. Electronically Signed   By: Bretta Bang III M.D.   On: 11/22/2018 16:25     Assessment/Plan: 23 year old patient admitted after jumping from 2 stories high [place] a jail. CT AP showed a wedge compression fracture of L2 with very small anterior wedging of T5, T6, and T12. I do not think these fractures warrant any neurosurgical intervention at this time. This will likely heal in a TLSO brace when he is stable enough to get out of bed.    Tiana Loft Yaslin Kirtley 11/22/2018 5:30 PM

## 2018-11-22 NOTE — H&P (Signed)
[facility] Trauma Admission Note  Jerry Finley 1996-06-03  [id number].    Chief Complaint/Reason for Consult: level 1 trauma - jump from second story with concern for TBI HPI:  Patient is a 23 year old male with PMH significant for HIV, polysubstance abuse, depression with suicidal ideations. Brought from jail after jumping from a second story and landing on a table striking his head. Initially was a non-activation and upgraded to level 1 for GCS < 9. Patient intermittently screaming that his back is broken and then becomes unresponsive. Decision made to intubate for further workup and airway protection. Patient was taken to [facility] last night for aggressive behavior and was discharged to jail.   ROS: Review of Systems  Unable to perform ROS: Mental acuity  Musculoskeletal: Positive for back pain. Negative for neck pain.    Family History  Problem Relation Age of Onset  . Healthy Mother   . Healthy Father     Past Medical History:  Diagnosis Date  . Concussion with loss of consciousness 06/25/2016  . 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 HIV (human immunodeficiency virus infection) (HCC)   . Legal problem 04/09/2016  . Nausea with vomiting 06/25/2016  . Smoker 11/20/2016    Past Surgical History:  Procedure Laterality Date  . HAND SURGERY      Social History:  reports that he has been smoking cigarettes. He has been smoking about 1.00 pack per day. He has never used smokeless tobacco. He reports current alcohol use. He reports previous drug use. Drugs: Cocaine, Marijuana, and Methamphetamines.  Allergies: No Known Allergies  (Not in a hospital admission)   There were no vitals taken for this visit. Physical Exam: Physical Exam Constitutional:      General: He is awake.     Appearance: He is well-developed. He is cachectic. He is not toxic-appearing.     Interventions: Cervical collar and backboard in place.  HENT:     Head: Abrasion (L  cheek) present.     Right Ear: External ear normal.     Left Ear: External ear normal.     Nose: Nose normal.     Mouth/Throat:     Lips: Pink.  Eyes:     General: Lids are normal. No scleral icterus.    Conjunctiva/sclera: Conjunctivae normal.     Comments: Pupils equal and round 5 mm bilaterally   Neck:     Musculoskeletal: Neck supple.     Trachea: No tracheal deviation.     Comments: Cervical collar in place Cardiovascular:     Rate and Rhythm: Regular rhythm. Tachycardia present.     Pulses:          Radial pulses are 2+ on the right side and 2+ on the left side.       Femoral pulses are 2+ on the right side and 2+ on the left side.      Dorsalis pedis pulses are 2+ on the right side and 2+ on the left side.  Pulmonary:     Effort: Pulmonary effort is normal.     Breath sounds: Normal breath sounds.  Abdominal:     General: Abdomen is flat. Bowel sounds are normal. There is no distension.     Palpations: Abdomen is soft.     Tenderness: There is no abdominal tenderness. There is no guarding or rebound.     Hernia: No hernia is present.  Genitourinary:    Penis: Normal.  Scrotum/Testes: Normal.  Musculoskeletal:     Comments: Moving all 4 extremities, no step-offs in back, no obvious deformity in all 4 extremities  Skin:    General: Skin is warm.     Findings: No rash.  Neurological:     GCS: GCS eye subscore is 4. GCS verbal subscore is 4. GCS motor subscore is 5.  Psychiatric:        Behavior: Behavior is uncooperative.     Comments: Not assessable at this time     Results for orders placed or performed during the hospital encounter of 11/22/18 (from the past 48 hour(s))  Type and screen Ordered by PROVIDER DEFAULT     Status: None (Preliminary result)   Collection Time: 11/22/18 10:50 AM  Result Value Ref Range   ABO/RH(D) PENDING    Antibody Screen PENDING    Sample Expiration      11/25/2018 Performed at [facility], [address]  [place], [place] [address]    Unit Number [id number]    Blood Component Type RED CELLS,LR    Unit division 00    Status of Unit ISSUED    Unit tag comment EMERGENCY RELEASE    Transfusion Status OK TO TRANSFUSE    Crossmatch Result PENDING    Unit Number [id number]    Blood Component Type RED CELLS,LR    Unit division 00    Status of Unit ISSUED    Unit tag comment EMERGENCY RELEASE    Transfusion Status OK TO TRANSFUSE    Crossmatch Result PENDING    No results found.    Assessment/Plan Jump from second story  L clavicle/ L scapula - ortho consulted Multiple L ribs w/ small L PTXB - multimodal pain control, IS, pulm toilet ?chronic left pulmonary process Possible concussion  HIV Polysubstance abuse - UDS  To ICU. Ortho consulted.   Wells Guiles, [facility] Surgery 11/22/2018, 11:12 AM Pager: [phone] Consults: [phone]

## 2018-11-23 ENCOUNTER — Other Ambulatory Visit: Payer: Self-pay

## 2018-11-23 ENCOUNTER — Inpatient Hospital Stay (HOSPITAL_COMMUNITY): Payer: Self-pay

## 2018-11-23 LAB — URINALYSIS, ROUTINE W REFLEX MICROSCOPIC
Bilirubin Urine: NEGATIVE
Glucose, UA: NEGATIVE mg/dL
Hgb urine dipstick: NEGATIVE
Ketones, ur: 5 mg/dL — AB
Leukocytes,Ua: NEGATIVE
Nitrite: NEGATIVE
Protein, ur: 30 mg/dL — AB
Specific Gravity, Urine: >1.046 — ABNORMAL HIGH (ref 1.005–1.030)
pH: 5 (ref 5.0–8.0)

## 2018-11-23 LAB — BPAM RBC
Blood Product Expiration Date: 202004092359
Blood Product Expiration Date: 202004162359
ISSUE DATE / TIME: 202003141732
ISSUE DATE / TIME: 202003142358
Unit Type and Rh: 5100
Unit Type and Rh: 5100

## 2018-11-23 LAB — COMPREHENSIVE METABOLIC PANEL
ALT: 30 U/L (ref 0–44)
AST: 85 U/L — ABNORMAL HIGH (ref 15–41)
Albumin: 3.2 g/dL — ABNORMAL LOW (ref 3.5–5.0)
Alkaline Phosphatase: 74 U/L (ref 38–126)
Anion gap: 7 (ref 5–15)
BUN: 14 mg/dL (ref 6–20)
CO2: 23 mmol/L (ref 22–32)
Calcium: 8.2 mg/dL — ABNORMAL LOW (ref 8.9–10.3)
Chloride: 110 mmol/L (ref 98–111)
Creatinine, Ser: 0.87 mg/dL (ref 0.61–1.24)
GFR calc Af Amer: >60 mL/min (ref >60)
GFR calc non Af Amer: >60 mL/min (ref >60)
Glucose, Bld: 80 mg/dL (ref 70–99)
Potassium: 4.1 mmol/L (ref 3.5–5.1)
Sodium: 140 mmol/L (ref 135–145)
Total Bilirubin: 1.2 mg/dL (ref 0.3–1.2)
Total Protein: 5.6 g/dL — ABNORMAL LOW (ref 6.5–8.1)

## 2018-11-23 LAB — TYPE AND SCREEN
ABO/RH(D): O NEG
Antibody Screen: NEGATIVE
Unit division: 0
Unit division: 0

## 2018-11-23 LAB — RAPID URINE DRUG SCREEN, HOSP PERFORMED
Amphetamines: NOT DETECTED
Barbiturates: NOT DETECTED
Benzodiazepines: POSITIVE — AB
Cocaine: POSITIVE — AB
Opiates: NOT DETECTED
Tetrahydrocannabinol: POSITIVE — AB

## 2018-11-23 MED ORDER — ACETAMINOPHEN 325 MG PO TABS
650.0000 mg | ORAL_TABLET | ORAL | Status: DC | PRN
  Administered 2018-11-23 – 2018-11-27 (×9): 650 mg via ORAL
  Filled 2018-11-23 (×9): qty 2

## 2018-11-23 MED ORDER — OXYCODONE HCL 5 MG PO TABS
5.0000 mg | ORAL_TABLET | Freq: Four times a day (QID) | ORAL | Status: DC | PRN
  Administered 2018-11-24: 5 mg via ORAL
  Filled 2018-11-23: qty 1

## 2018-11-23 MED ORDER — HYDROMORPHONE HCL 1 MG/ML IJ SOLN: 0.5000 mg | INTRAMUSCULAR | Status: DC | PRN

## 2018-11-23 MED ORDER — GABAPENTIN 600 MG PO TABS
300.0000 mg | ORAL_TABLET | Freq: Three times a day (TID) | ORAL | Status: DC
  Administered 2018-11-23 – 2018-11-27 (×11): 300 mg via ORAL
  Filled 2018-11-23 (×11): qty 1

## 2018-11-23 MED ORDER — IBUPROFEN 600 MG PO TABS
600.0000 mg | ORAL_TABLET | Freq: Four times a day (QID) | ORAL | Status: DC | PRN
  Administered 2018-11-23 – 2018-11-26 (×7): 600 mg via ORAL
  Filled 2018-11-23 (×3): qty 1
  Filled 2018-11-23: qty 3
  Filled 2018-11-23 (×3): qty 1

## 2018-11-23 NOTE — Progress Notes (Signed)
Pt removed c collar and threw across the room. Refused to reapply. Also refused TLSO brace. Educated on importance of c collar brace until c spine cleared. Continues to refuse. Will continue to re-educate.Educated on importance of TLSO brace. Will continue to encourage and educate on use of TLSO brace when OOB. Leo Grosser, NP aware of TLSO refusal. Dr. Cliffton Asters aware of c collar refusal. Keep in ICU at this time per Dr. Cliffton Asters.
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
## 2018-11-23 NOTE — Progress Notes (Signed)
Orthopedic Tech Progress Note Patient Details:  MAMOUN SPEARMAN 05-07-1996 [id number] Called in brace order Patient ID: KEESHON LINEHAN, male   DOB: 1996-08-17, 23 y.o.   MRN: [id number]   Donald Pore 11/23/2018, 12:06 PM

## 2018-11-23 NOTE — Procedures (Signed)
Extubation Procedure Note  Patient Details:   Name: Jerry Finley DOB: October 02, 1995 MRN: [id number]   Airway Documentation:    Vent end date: 11/23/18 Vent end time: 0817   Evaluation  O2 sats: stable throughout Complications: No apparent complications Patient did tolerate procedure well. Bilateral Breath Sounds: Clear, Diminished   Yes   Patient extubated to 3L [place] without complications. Positive cuff leak noted. RN at bedside. Vitals are stable. Will continue to monitor.  Rance Muir 11/23/2018, 8:31 AM

## 2018-11-23 NOTE — Progress Notes (Signed)
Subjective: Patient currently in restraints.  Apparently he was thrashing around all night.  He was moving all 4 extremities.  He remains on the ventilator.   Objective: Vital signs in last 24 hours: Temp:  [97.8 F (36.6 C)-100.7 F (38.2 C)] 99.1 F (37.3 C) (03/15 0346) Pulse Rate:  [68-104] 98 (03/15 0300) Resp:  [13-34] 14 (03/15 0300) BP: (98-178)/(52-126) 111/66 (03/15 0300) SpO2:  [91 %-100 %] 100 % (03/15 0346) FiO2 (%):  [40 %-100 %] 40 % (03/15 0346) Weight:  [56.7 kg-70 kg] 56.7 kg (03/14 1252)  Patient on the ventilator.  He opens eyes to verbal command but does not follow commands. Left upper extremity with swelling about the shoulder.  No skin tenting.  No other evidence of injury.  Imaging: X-rays of the left shoulder and left clavicle demonstrate a segmental clavicle fracture with a fracture at the proximal and distal ends.  Minimally displaced.  Fracture of the glenoid with well reduced shoulder.  Assessment/Plan: We will plan for continued nonoperative treatment of his left scapular fracture with intra-articular extension and left segmental clavicle fracture.  Sling is in the room but he is currently in for point restraints.  Once he becomes more alert we will place him in a sling and plan for orthopedic follow-up.  He should remain nonweightbearing on the left upper extremity.  He will follow-up with me in 2 weeks to ensure fracture is healing well.    Terance Hart 11/23/2018, 6:41 AM

## 2018-11-23 NOTE — Progress Notes (Signed)
Wasted 60cc versed gtt and 170cc fent gtt in disposal container. Witnessed by Ashok Cordia, [employer] fundraiser.

## 2018-11-23 NOTE — Progress Notes (Signed)
Patient ID: Jerry Finley, male   DOB: 09-29-1995, 23 y.o.   MRN: [id number] Patient extubated.  He does complain of some back pain.  Denies leg pain.  No numbness tingling or weakness.  Has good strength 5 out of 5 in all muscle groups of the lower extremities to and in bed exam.  Foley catheter in place.

## 2018-11-24 DIAGNOSIS — F4325 Adjustment disorder with mixed disturbance of emotions and conduct: Secondary | ICD-10-CM

## 2018-11-24 LAB — BASIC METABOLIC PANEL
Anion gap: 7 (ref 5–15)
BUN: 8 mg/dL (ref 6–20)
CO2: 24 mmol/L (ref 22–32)
Calcium: 8.6 mg/dL — ABNORMAL LOW (ref 8.9–10.3)
Chloride: 108 mmol/L (ref 98–111)
Creatinine, Ser: 0.62 mg/dL (ref 0.61–1.24)
GFR calc Af Amer: >60 mL/min (ref >60)
GFR calc non Af Amer: >60 mL/min (ref >60)
Glucose, Bld: 116 mg/dL — ABNORMAL HIGH (ref 70–99)
POTASSIUM: 3.7 mmol/L (ref 3.5–5.1)
Sodium: 139 mmol/L (ref 135–145)

## 2018-11-24 LAB — CBC WITH DIFFERENTIAL/PLATELET
Abs Immature Granulocytes: 0.04 10*3/uL (ref 0.00–0.07)
Basophils Absolute: 0 10*3/uL (ref 0.0–0.1)
Basophils Relative: 0 %
Eosinophils Absolute: 0.2 10*3/uL (ref 0.0–0.5)
Eosinophils Relative: 2 %
HCT: 33 % — ABNORMAL LOW (ref 39.0–52.0)
Hemoglobin: 11.5 g/dL — ABNORMAL LOW (ref 13.0–17.0)
Immature Granulocytes: 0 %
Lymphocytes Relative: 25 %
Lymphs Abs: 2.6 10*3/uL (ref 0.7–4.0)
MCH: 31 pg (ref 26.0–34.0)
MCHC: 34.8 g/dL (ref 30.0–36.0)
MCV: 88.9 fL (ref 80.0–100.0)
Monocytes Absolute: 0.9 10*3/uL (ref 0.1–1.0)
Monocytes Relative: 9 %
Neutro Abs: 6.3 10*3/uL (ref 1.7–7.7)
Neutrophils Relative %: 64 %
Platelets: 228 10*3/uL (ref 150–400)
RBC: 3.71 MIL/uL — ABNORMAL LOW (ref 4.22–5.81)
RDW: 12.9 % (ref 11.5–15.5)
WBC: 10.1 10*3/uL (ref 4.0–10.5)
nRBC: 0 % (ref 0.0–0.2)

## 2018-11-24 LAB — CBC
HCT: 32.9 % — ABNORMAL LOW (ref 39.0–52.0)
Hemoglobin: 11 g/dL — ABNORMAL LOW (ref 13.0–17.0)
MCH: 30.6 pg (ref 26.0–34.0)
MCHC: 33.4 g/dL (ref 30.0–36.0)
MCV: 91.4 fL (ref 80.0–100.0)
NRBC: 0 % (ref 0.0–0.2)
Platelets: 209 10*3/uL (ref 150–400)
RBC: 3.6 MIL/uL — ABNORMAL LOW (ref 4.22–5.81)
RDW: 13.4 % (ref 11.5–15.5)
WBC: 10 10*3/uL (ref 4.0–10.5)

## 2018-11-24 LAB — HIV-1/2 AB - DIFFERENTIATION
HIV 1 Ab: POSITIVE — AB
HIV 2 Ab: NEGATIVE

## 2018-11-24 MED ORDER — FLUOXETINE HCL 20 MG PO CAPS
20.0000 mg | ORAL_CAPSULE | Freq: Every day | ORAL | Status: DC
  Administered 2018-11-24 – 2018-11-27 (×4): 20 mg via ORAL
  Filled 2018-11-24 (×4): qty 1

## 2018-11-24 MED ORDER — METHADONE HCL 5 MG PO TABS
55.0000 mg | ORAL_TABLET | Freq: Every day | ORAL | Status: DC
  Administered 2018-11-24 – 2018-11-27 (×4): 55 mg via ORAL
  Filled 2018-11-24 (×4): qty 5

## 2018-11-24 MED ORDER — OXYCODONE HCL 5 MG PO TABS
10.0000 mg | ORAL_TABLET | Freq: Four times a day (QID) | ORAL | Status: DC | PRN
  Administered 2018-11-24 – 2018-11-27 (×5): 10 mg via ORAL
  Filled 2018-11-24 (×5): qty 2

## 2018-11-24 MED ORDER — BICTEGRAVIR-EMTRICITAB-TENOFOV 50-200-25 MG PO TABS
1.0000 | ORAL_TABLET | Freq: Every day | ORAL | Status: DC
  Administered 2018-11-25 – 2018-11-27 (×3): 1 via ORAL
  Filled 2018-11-24 (×4): qty 1

## 2018-11-24 NOTE — Consult Note (Signed)
[facility] Face-to-Face Psychiatry Consult   Reason for Consult:  Suicide attempt  Referring Physician:  Dr. Violeta Gelinas Patient Identification: Jerry Finley MRN:  [id number] Principal Diagnosis: Adjustment disorder with mixed disturbance of emotions and conduct Diagnosis:  Active Problems:   Multiple trauma   Total Time spent with patient: 1 hour  Subjective:   Jerry Finley is a 23 y.o. male patient admitted with multiple injuries sustained after jumping from a height.  HPI:   Per chart review, patient was admitted with multiple injuries sustained after jumping from 2 stories high in jail. He is reportedly in custody after an attempted burglary. He was sent to the hospital prior to going to jail for medical clearance. He sustained a left clavicular and scapula fracture, left rib fractures, occult left pneumothorax, possible small anterior wedging of T5, T6 and T12 and lumbar compression fracture of L2. He required intubation and is now extubated. Yesterday he removed his cervical collar and threw it across the room. He also refused TLSO brace. UDS was positive for benzodiazepines, cocaine and THC and BAL was negative on admission. Home medications include Methadone 55 mg daily.  Of note, patient was last seen by telepsych in October 2019 for bizarre behavior and psychosis in the setting of polydrug use. He was psychiatrically cleared and given outpatient resources.     On interview,  Jerry Finley reports that he was in jail for 1.5 days. He became upset because his father would not bail him out so he harmed himself secondary to anger. He denies trying to attempt suicide. He reports a history of poor impulse control in the setting of anger and related to substance use. He denies SI, HI or AVH. He admits to a history of depression. He was diagnosed at 23 y/o. He was started on Prozac while in rehab 6 months ago. He stopped the medication after discharge. He reports that it was helpful  for his mood. He reports recent poor sleep in the hospital since he was not receiving Methadone. It has been started today. He reports that he is currently being tapered off Methadone by choice. He was previously prescribed 75 mg daily. He denies a history of manic symptoms.   Past Psychiatric History: Polydrug abuse  Risk to Self:  None. Denies SI.  Risk to Others:  None. Denies HI. Prior Inpatient Therapy:  He was hospitalized at 23 y/o for cutting behaviors after a breakup with a girlfriend.  Prior Outpatient Therapy:  [facility] [place].   Past Medical History:  Past Medical History:  Diagnosis Date  . Concussion with loss of consciousness 06/25/2016  . 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 HIV (human immunodeficiency virus infection) (HCC)   . Legal problem 04/09/2016  . Nausea with vomiting 06/25/2016  . Smoker 11/20/2016    Past Surgical History:  Procedure Laterality Date  . HAND SURGERY     Family History:  Family History  Problem Relation Age of Onset  . Healthy Mother   . Healthy Father    Family Psychiatric  History: Paternal aunt-depression and multiple suicide attempts and mother has a history of self-injurious behaviors.  Social History:  Social History   Substance and Sexual Activity  Alcohol Use Yes   Comment: socially     Social History   Substance and Sexual Activity  Drug Use Not Currently  . Types: Cocaine, Marijuana, Methamphetamines    Social History   Socioeconomic History  . Marital status: Single  Spouse name: Not on file  . Number of children: Not on file  . Years of education: Not on file  . Highest education level: Not on file  Occupational History  . Not on file  Social Needs  . Financial resource strain: Not on file  . Food insecurity:    Worry: Not on file    Inability: Not on file  . Transportation needs:    Medical: Not on file    Non-medical: Not on file  Tobacco Use  . Smoking status: Current  Every Day Smoker    Packs/day: 1.00    Types: Cigarettes  . Smokeless tobacco: Never Used  Substance and Sexual Activity  . Alcohol use: Yes    Comment: socially  . Drug use: Not Currently    Types: Cocaine, Marijuana, Methamphetamines  . Sexual activity: Yes    Partners: Female    Comment: refused condoms  Lifestyle  . Physical activity:    Days per week: Not on file    Minutes per session: Not on file  . Stress: Not on file  Relationships  . Social connections:    Talks on phone: Not on file    Gets together: Not on file    Attends religious service: Not on file    Active member of club or organization: Not on file    Attends meetings of clubs or organizations: Not on file    Relationship status: Not on file  Other Topics Concern  . Not on file  Social History Narrative  . Not on file   Additional Social History: He lives alone. He has a 57.20 year old son that lives with child's mother. He works in Aeronautical engineer. He has been incarcerated multiple times. He was last in prison 3 years ago for probation violation of breaking & entering. He is currently charged with attempted burglary. He has a history of opioid abuse. He was on Suboxone in the past and is currently taking Methadone x 2 years. He reports that he last used opioids 6 years ago. He admits to recent Ativan, cocaine and marijuana use.     Allergies:  No Known Allergies  Labs:  Results for orders placed or performed during the hospital encounter of 11/22/18 (from the past 48 hour(s))  MRSA PCR Screening     Status: None   Collection Time: 11/22/18  4:30 PM  Result Value Ref Range   MRSA by PCR NEGATIVE NEGATIVE    Comment:        The GeneXpert MRSA Assay (FDA approved for NASAL specimens only), is one component of a comprehensive MRSA colonization surveillance program. It is not intended to diagnose MRSA infection nor to guide or monitor treatment for MRSA infections. Performed at [facility], [address], [place], [place] [address]   Rapid urine drug screen (hospital performed)     Status: Abnormal   Collection Time: 11/23/18  3:23 AM  Result Value Ref Range   Opiates NONE DETECTED NONE DETECTED   Cocaine POSITIVE (A) NONE DETECTED   Benzodiazepines POSITIVE (A) NONE DETECTED   Amphetamines NONE DETECTED NONE DETECTED   Tetrahydrocannabinol POSITIVE (A) NONE DETECTED   Barbiturates NONE DETECTED NONE DETECTED    Comment: (NOTE) DRUG SCREEN FOR MEDICAL PURPOSES ONLY.  IF CONFIRMATION IS NEEDED FOR ANY PURPOSE, NOTIFY LAB WITHIN 5 DAYS. LOWEST DETECTABLE LIMITS FOR URINE DRUG SCREEN Drug Class  Cutoff (ng/mL) Amphetamine and metabolites    1000 Barbiturate and metabolites    200 Benzodiazepine                 200 Tricyclics and metabolites     300 Opiates and metabolites        300 Cocaine and metabolites        300 THC                            50 Performed at [facility], [address] [place], [place] [address]   Urinalysis, Routine w reflex microscopic     Status: Abnormal   Collection Time: 11/23/18  3:24 AM  Result Value Ref Range   Color, Urine YELLOW YELLOW   APPearance CLEAR CLEAR   Specific Gravity, Urine >1.046 (H) 1.005 - 1.030   pH 5.0 5.0 - 8.0   Glucose, UA NEGATIVE NEGATIVE mg/dL   Hgb urine dipstick NEGATIVE NEGATIVE   Bilirubin Urine NEGATIVE NEGATIVE   Ketones, ur 5 (A) NEGATIVE mg/dL   Protein, ur 30 (A) NEGATIVE mg/dL   Nitrite NEGATIVE NEGATIVE   Leukocytes,Ua NEGATIVE NEGATIVE   RBC / HPF 11-20 0 - 5 RBC/hpf   WBC, UA 0-5 0 - 5 WBC/hpf   Bacteria, UA RARE (A) NONE SEEN   Mucus PRESENT     Comment: Performed at [facility], [address], [place], [place] [address]  CBC     Status: Abnormal   Collection Time: 11/23/18  6:32 AM  Result Value Ref Range   WBC 10.0 4.0 - 10.5 K/uL   RBC 3.60 (L) 4.22 - 5.81 MIL/uL   Hemoglobin 11.0 (L) 13.0 - 17.0 g/dL    Comment: REPEATED TO VERIFY   HCT 32.9 (L) 39.0 -  52.0 %   MCV 91.4 80.0 - 100.0 fL   MCH 30.6 26.0 - 34.0 pg   MCHC 33.4 30.0 - 36.0 g/dL   RDW 69.4 50.3 - 88.8 %   Platelets 209 150 - 400 K/uL   nRBC 0.0 0.0 - 0.2 %    Comment: Performed at [facility], [address], [place], [place] [address]  Comprehensive metabolic panel     Status: Abnormal   Collection Time: 11/23/18  6:32 AM  Result Value Ref Range   Sodium 140 135 - 145 mmol/L   Potassium 4.1 3.5 - 5.1 mmol/L   Chloride 110 98 - 111 mmol/L   CO2 23 22 - 32 mmol/L   Glucose, Bld 80 70 - 99 mg/dL   BUN 14 6 - 20 mg/dL   Creatinine, Ser 4.91 0.61 - 1.24 mg/dL   Calcium 8.2 (L) 8.9 - 10.3 mg/dL   Total Protein 5.6 (L) 6.5 - 8.1 g/dL   Albumin 3.2 (L) 3.5 - 5.0 g/dL   AST 85 (H) 15 - 41 U/L   ALT 30 0 - 44 U/L   Alkaline Phosphatase 74 38 - 126 U/L   Total Bilirubin 1.2 0.3 - 1.2 mg/dL   GFR calc non Af Amer >60 >60 mL/min   GFR calc Af Amer >60 >60 mL/min   Anion gap 7 5 - 15    Comment: Performed at [facility], [address], [place], [place] [address]  CBC with Differential/Platelet     Status: Abnormal   Collection Time: 11/24/18  5:41 AM  Result Value Ref Range   WBC 10.1 4.0 -  10.5 K/uL   RBC 3.71 (L) 4.22 - 5.81 MIL/uL   Hemoglobin 11.5 (L) 13.0 - 17.0 g/dL   HCT 34.1 (L) 96.2 - 22.9 %   MCV 88.9 80.0 - 100.0 fL   MCH 31.0 26.0 - 34.0 pg   MCHC 34.8 30.0 - 36.0 g/dL   RDW 79.8 92.1 - 19.4 %   Platelets 228 150 - 400 K/uL   nRBC 0.0 0.0 - 0.2 %   Neutrophils Relative % 64 %   Neutro Abs 6.3 1.7 - 7.7 K/uL   Lymphocytes Relative 25 %   Lymphs Abs 2.6 0.7 - 4.0 K/uL   Monocytes Relative 9 %   Monocytes Absolute 0.9 0.1 - 1.0 K/uL   Eosinophils Relative 2 %   Eosinophils Absolute 0.2 0.0 - 0.5 K/uL   Basophils Relative 0 %   Basophils Absolute 0.0 0.0 - 0.1 K/uL   Immature Granulocytes 0 %   Abs Immature Granulocytes 0.04 0.00 - 0.07 K/uL    Comment: Performed at [facility], [address] [place], [place] [address]  Basic  metabolic panel     Status: Abnormal   Collection Time: 11/24/18  5:41 AM  Result Value Ref Range   Sodium 139 135 - 145 mmol/L   Potassium 3.7 3.5 - 5.1 mmol/L   Chloride 108 98 - 111 mmol/L   CO2 24 22 - 32 mmol/L   Glucose, Bld 116 (H) 70 - 99 mg/dL   BUN 8 6 - 20 mg/dL   Creatinine, Ser 1.44 0.61 - 1.24 mg/dL   Calcium 8.6 (L) 8.9 - 10.3 mg/dL   GFR calc non Af Amer >60 >60 mL/min   GFR calc Af Amer >60 >60 mL/min   Anion gap 7 5 - 15    Comment: Performed at [facility], [address], [place], [place] [address]    Current Facility-Administered Medications  Medication Dose Route Frequency Provider Last Rate Last Dose  . acetaminophen (TYLENOL) tablet 650 mg  650 mg Oral Q4H PRN Andria Meuse, MD   650 mg at 11/24/18 3149  . enoxaparin (LOVENOX) injection 40 mg  40 mg Subcutaneous Q24H Abigail Miyamoto, MD   40 mg at 11/24/18 0835  . gabapentin (NEURONTIN) tablet 300 mg  300 mg Oral TID Joaquim Nam, RPH   300 mg at 11/24/18 0935  . HYDROmorphone (DILAUDID) injection 0.5 mg  0.5 mg Intravenous Q3H PRN Andria Meuse, MD      . ibuprofen (ADVIL,MOTRIN) tablet 600 mg  600 mg Oral Q6H PRN Andria Meuse, MD   600 mg at 11/23/18 2002  . ondansetron (ZOFRAN-ODT) disintegrating tablet 4 mg  4 mg Oral Q6H PRN Abigail Miyamoto, MD       Or  . ondansetron [facility]) injection 4 mg  4 mg Intravenous Q6H PRN Abigail Miyamoto, MD      . oxyCODONE (Oxy IR/ROXICODONE) immediate release tablet 10 mg  10 mg Oral Q6H PRN Violeta Gelinas, MD   10 mg at 11/24/18 0935    Musculoskeletal: Strength & Muscle Tone: within normal limits Gait & Station: unable to stand since left lower extremity is handcuffed to bed.  Patient leans: N/A  Psychiatric Specialty Exam: Physical Exam  Nursing note and vitals reviewed. Constitutional: He is oriented to person, place, and time. He appears well-developed and well-nourished.  HENT:  Head: Normocephalic and atraumatic.  Neck:  Normal range of motion.  Respiratory: Effort normal.  Musculoskeletal: Normal range of  motion.  Neurological: He is alert and oriented to person, place, and time.  Psychiatric: He has a normal mood and affect. His speech is normal and behavior is normal. Thought content normal. Cognition and memory are normal. He expresses impulsivity.    Review of Systems  Cardiovascular: Positive for chest pain.  Gastrointestinal: Negative for constipation, diarrhea, nausea and vomiting.  Musculoskeletal: Positive for back pain.  Psychiatric/Behavioral: Positive for depression and substance abuse. Negative for hallucinations and suicidal ideas. The patient does not have insomnia.   All other systems reviewed and are negative.   Blood pressure 125/84, pulse 85, temperature 100.2 F (37.9 C), temperature source Oral, resp. rate 18, height  (1.702 m), weight 56.7 kg, SpO2 99 %.Body mass index is 19.58 kg/m.  General Appearance: Fairly Groomed, young, Caucasian male, wearing a hospital gown with multiple lacerations and bruises to his face who is lying in bed. NAD.   Eye Contact:  Good  Speech:  Clear and Coherent and Normal Rate  Volume:  Normal  Mood:  Depressed  Affect:  Appropriate and Full Range  Thought Process:  Goal Directed, Linear and Descriptions of Associations: Intact  Orientation:  Full (Time, Place, and Person)  Thought Content:  Logical  Suicidal Thoughts:  No  Homicidal Thoughts:  No  Memory:  Immediate;   Good Recent;   Good Remote;   Good  Judgement:  Poor  Insight:  Fair  Psychomotor Activity:  Normal  Concentration:  Concentration: Good and Attention Span: Good  Recall:  Good  Fund of Knowledge:  Good  Language:  Good  Akathisia:  No  Handed:  Right  AIMS (if indicated):   N/A  Assets:  Communication Skills Desire for Improvement Financial Resources/Insurance Housing Physical Health Social Support  ADL's:  Intact  Cognition:  WNL  Sleep:   Okay    Assessment:   Jerry Finley is a 23 y.o. male who was admitted with multiple injuries sustained after jumping from 2 stories high in jail. He denies SI or trying to harm himself although he does admit to poor impulse control in the setting of anger. He became upset that his father would not bail him out of jail as he has in the past. He reports protective factors including his young son. He denies HI or AVH. He would like to restart Prozac for depression. He does not warrant inpatient psychiatric hospitalization at this time and should receive continued mental health treatment at the jail.   Treatment Plan Summary: -Restart Prozac 20 mg daily for mood.  -EKG reviewed and QTc 453 on 3/13. Please closely monitor when starting or increasing QTc prolonging agents.  -Patient will follow up with [facility] [place] for further medication management.  -Patient is psychiatrically cleared. Psychiatry will sign off on patient at this time. Please consult psychiatry again as needed.    Disposition: Patient does not meet criteria for psychiatric inpatient admission.  Cherly Beach, DO 11/24/2018 12:01 PM

## 2018-11-24 NOTE — Evaluation (Signed)
Physical Therapy Evaluation Patient Details Name: Jerry Finley MRN: [id number] DOB: [birth year] Today's Date: 11/24/2018   History of Present Illness  23 y.o. male admitted on 11/22/18 from jail after jumppting from the second story.  Pt injuries are as follows: acute resp failure requiring intubation from 3/14-3/15/20, L clavicle and L scapula fx (sling), L righ fxs 1, 3, 5, and PTX.  T5, 6, 12 and L2 fxs (TLSO when up), continued methadone treatment, and psych consult pending as he intended to harm himself.  Pt with other significant PMH of HIV, concussion, IV drug use, and hand surgery.    Clinical Impression  Pt was able to ambulate and demonstrate safety on stairs.  He needed min assist to manage his left arm sling and his back brace (which I adjusted to better fit him).  He would benefit from working with OT prior to d/c to both reinforce back precautions and see if he can truly manage his brace and sling on his own.   PT to follow acutely for deficits listed below.      Follow Up Recommendations No PT follow up    Equipment Recommendations  None recommended by PT    Recommendations for Other Services   NA    Precautions / Restrictions Precautions Precautions: Back Precaution Booklet Issued: Yes (comment) Precaution Comments: back precautions reviewed as well as when to wear the brace and how to don/doff Required Braces or Orthoses: Sling;Spinal Brace Spinal Brace: Thoracolumbosacral orthotic;Other (comment)(when up)      Mobility  Bed Mobility Overal bed mobility: Modified Independent                Transfers Overall transfer level: Modified independent                  Ambulation/Gait Ambulation/Gait assistance: Supervision Gait Distance (Feet): 500 Feet Assistive device: None Gait Pattern/deviations: Step-through pattern;Wide base of support     General Gait Details: Pt with wide BOS and a few stumbles due to walking in shackles, not actual gait  difficulties.   Stairs Stairs: Yes Stairs assistance: Supervision Stair Management: One rail Right;One rail Left;Alternating pattern;Forwards Number of Stairs: 10 General stair comments: Pt able to do stairs easily with railing.  Supervision for safety, no signs of LE weakness on stairs or during gait.          Balance Overall balance assessment: Needs assistance Sitting-balance support: Feet supported;No upper extremity supported Sitting balance-Leahy Scale: Good     Standing balance support: Single extremity supported;No upper extremity supported Standing balance-Leahy Scale: Good                               Pertinent Vitals/Pain Pain Assessment: Faces Faces Pain Scale: Hurts whole lot Pain Location: L clavicle the most Pain Descriptors / Indicators: Aching;Burning Pain Intervention(s): Limited activity within patient's tolerance;Monitored during session;Repositioned    Home Living Family/patient expects to be discharged to:: Other (Comment)                 Additional Comments: jail    Prior Function Level of Independence: Independent               Hand Dominance   Dominant Hand: Right    Extremity/Trunk Assessment   Upper Extremity Assessment Upper Extremity Assessment: Defer to OT evaluation    Lower Extremity Assessment Lower Extremity Assessment: Overall WFL for tasks assessed    Cervical / Trunk  Assessment Cervical / Trunk Assessment: Other exceptions Cervical / Trunk Exceptions: multiple spine fxs  Communication   Communication: No difficulties  Cognition Arousal/Alertness: Awake/alert Behavior During Therapy: WFL for tasks assessed/performed Overall Cognitive Status: Within Functional Limits for tasks assessed(not specifically tested)                                 General Comments: Pt reports the abrasions on his face are not from the fall.              Assessment/Plan    PT Assessment Patient  needs continued PT services  PT Problem List Decreased strength;Decreased range of motion;Decreased activity tolerance;Decreased balance;Decreased mobility;Decreased knowledge of use of DME;Decreased knowledge of precautions;Pain       PT Treatment Interventions DME instruction;Gait training;Stair training;Functional mobility training;Therapeutic activities;Therapeutic exercise;Balance training;Patient/family education;Modalities    PT Goals (Current goals can be found in the Care Plan section)  Acute Rehab PT Goals Patient Stated Goal: to get home to his 34.4 year old son PT Goal Formulation: With patient Time For Goal Achievement: 12/01/18 Potential to Achieve Goals: Good    Frequency Min 3X/week           AM-PAC PT "6 Clicks" Mobility  Outcome Measure Help needed turning from your back to your side while in a flat bed without using bedrails?: None Help needed moving from lying on your back to sitting on the side of a flat bed without using bedrails?: None Help needed moving to and from a bed to a chair (including a wheelchair)?: None Help needed standing up from a chair using your arms (e.g., wheelchair or bedside chair)?: None Help needed to walk in hospital room?: None Help needed climbing 3-5 steps with a railing? : None 6 Click Score: 24    End of Session Equipment Utilized During Treatment: Back brace;Other (comment)(L UE sling) Activity Tolerance: Patient limited by pain Patient left: in bed;with call bell/phone within reach;with restraints reapplied;with nursing/sitter in room;Other (comment)(guard in room as well )   PT Visit Diagnosis: Muscle weakness (generalized) (M62.81);Difficulty in walking, not elsewhere classified (R26.2);Pain Pain - Right/Left: Left Pain - part of body: Shoulder    Time: 3888-7579 PT Time Calculation (min) (ACUTE ONLY): 26 min   Charges:        Lurena Joiner B. Chaney Ingram, PT, DPT  Acute Rehabilitation [phone] pager #[phone]  [phone] office   PT Evaluation $PT Eval Low Complexity: 1 Low PT Treatments $Gait Training: 8-22 mins        11/24/2018, 5:06 PM

## 2018-11-24 NOTE — Progress Notes (Addendum)
Patient ID: Jerry Finley, male   DOB: Jun 10, 1996, 23 y.o.   MRN: [id number]    Subjective: Requesting his usual Methadone dose, reports he was on 55mg /d via [facility]  Objective: Vital signs in last 24 hours: Temp:  [100 F (37.8 C)-100.2 F (37.9 C)] 100.2 F (37.9 C) (03/15 1600) Pulse Rate:  [107-121] 107 (03/15 1400) Resp:  [12-25] 15 (03/16 0800) BP: (110-148)/(63-105) 146/96 (03/16 0800) SpO2:  [95 %-100 %] 98 % (03/16 0400) Last BM Date: (PTA)  Intake/Output from previous day: 03/15 0701 - 03/16 0700 In: 1793.6 [P.O.:1040; I.V.:753.6] Out: 1350 [Urine:1350] Intake/Output this shift: Total I/O In: 240 [P.O.:240] Out: -   General appearance: cooperative Neck: NT, no pain on AROM Chest wall: left sided chest wall tenderness Cardio: regular rate and rhythm GI: soft, NT Extremities: sling LUE and contusion shoulder Neuro: MAE, F/C  Lab Results: CBC  Recent Labs    11/23/18 0632 11/24/18 0541  WBC 10.0 10.1  HGB 11.0* 11.5*  HCT 32.9* 33.0*  PLT 209 228   BMET Recent Labs    11/23/18 0632 11/24/18 0541  NA 140 139  K 4.1 3.7  CL 110 108  CO2 23 24  GLUCOSE 80 116*  BUN 14 8  CREATININE 0.87 0.62  CALCIUM 8.2* 8.6*   Assessment/Plan:  Jump from 2nd floor in jail Acute respiratory failure - doing well since extubation L clavicle and scapula FXs - sling per Dr. Susa Simmonds L rib FX 1.3.5 with apical PTX - PTX resolved on CXR T5,6,12 and L2 FXs - TLSO per Dr. Yetta Barre Current methadone treatment - pharmacy to confirm dose and we will resume Psych - reports he was trying to hurt himself when he jumped but denies current suicidal ideation. Will have psychiatry eval. FEN - reg diet, SL IV, increase PRN Oxy VTE - Lovenox Dispo - to floor, PT/OT, incarcerated   LOS: 2 days    Violeta Gelinas, MD, MPH, FACS Trauma: [phone] General Surgery: [phone]  11/24/2018

## 2018-11-24 NOTE — Progress Notes (Signed)
Pt arrived into 6N21 from 4N with sherrif and shackles on feet.  [name] here for suicide sitter.  Pt into bed and assisted with taking off TLSO brace.

## 2018-11-25 LAB — BLOOD PRODUCT ORDER (VERBAL) VERIFICATION

## 2018-11-25 MED ORDER — DOCUSATE SODIUM 100 MG PO CAPS
100.0000 mg | ORAL_CAPSULE | Freq: Two times a day (BID) | ORAL | Status: DC
  Administered 2018-11-25 – 2018-11-27 (×5): 100 mg via ORAL
  Filled 2018-11-25 (×5): qty 1

## 2018-11-25 MED ORDER — POLYETHYLENE GLYCOL 3350 17 G PO PACK: 17.0000 g | PACK | Freq: Every day | ORAL | Status: DC | PRN

## 2018-11-25 NOTE — Progress Notes (Signed)
Physical Therapy Treatment Patient Details Name: Jerry Finley MRN: [id number] DOB: July 19, 1996 Today's Date: 11/25/2018    History of Present Illness 23 y.o. male admitted on 11/22/18 from jail after jumping from the second story.  Pt injuries are as follows: acute resp failure requiring intubation from 3/14-3/15/20, L clavicle and L scapula fx (sling), L righ fxs 1, 3, 5, and PTX.  T5, 6, 12 and L2 fxs (TLSO when up), continued methadone treatment, and psych consult pending as he intended to harm himself.  Pt with other significant PMH of HIV, concussion, IV drug use, and hand surgery.      PT Comments    After working with OT pt able to ambulate in hallway with supervision except for 1xLoB requiring minA for steadying due to tripping over his shackles. Pt also able to ascend/descend 14 steps with supervision. D/c plans remain appropriate at this time.    Follow Up Recommendations  No PT follow up     Equipment Recommendations  None recommended by PT       Precautions / Restrictions Precautions Precautions: Back Precaution Booklet Issued: Yes (comment) Precaution Comments: back precautions reviewed as well as when to wear the brace and how to don/doff Required Braces or Orthoses: Sling;Spinal Brace Spinal Brace: Thoracolumbosacral orthotic;Other (comment)(when up) Restrictions Weight Bearing Restrictions: Yes LUE Weight Bearing: Non weight bearing    Mobility  Bed Mobility Overal bed mobility: Modified Independent                Transfers Overall transfer level: Modified independent                  Ambulation/Gait Ambulation/Gait assistance: Min assist;Supervision Gait Distance (Feet): 500 Feet Assistive device: None Gait Pattern/deviations: Step-through pattern;Wide base of support     General Gait Details: supervision for majority of ambulation, 1xLoB requiring minA to steady   Stairs Stairs: Yes Stairs assistance: Supervision Stair  Management: One rail Right;One rail Left;Alternating pattern;Forwards   General stair comments: Pt able to do stairs easily with railing.  Supervision for safety, no signs of LE weakness on stairs or during gait.        Balance Overall balance assessment: Needs assistance Sitting-balance support: Feet supported;No upper extremity supported Sitting balance-Leahy Scale: Good     Standing balance support: Single extremity supported;No upper extremity supported Standing balance-Leahy Scale: Good                              Cognition Arousal/Alertness: Awake/alert Behavior During Therapy: WFL for tasks assessed/performed Overall Cognitive Status: Within Functional Limits for tasks assessed(not specifically tested)                                        Exercises Other Exercises Other Exercises: since pt unable to have IS, PT educated in deep breathing technique for increased lung capacity as well as anxiety reduction technique    General Comments        Pertinent Vitals/Pain Pain Assessment: Faces Faces Pain Scale: Hurts a little bit Pain Location: L clavicle the most Pain Descriptors / Indicators: Aching;Burning Pain Intervention(s): Monitored during session    Home Living Family/patient expects to be discharged to:: Other (Comment)               Additional Comments: jail    Prior Function Level of Independence: Independent  PT Goals (current goals can now be found in the care plan section) Acute Rehab PT Goals Patient Stated Goal: to get home to his 68.21 year old son PT Goal Formulation: With patient Time For Goal Achievement: 12/01/18 Potential to Achieve Goals: Good Progress towards PT goals: Progressing toward goals    Frequency    Min 3X/week      PT Plan Current plan remains appropriate    Co-evaluation              AM-PAC PT "6 Clicks" Mobility   Outcome Measure  Help needed turning from your  back to your side while in a flat bed without using bedrails?: None Help needed moving from lying on your back to sitting on the side of a flat bed without using bedrails?: None Help needed moving to and from a bed to a chair (including a wheelchair)?: None Help needed standing up from a chair using your arms (e.g., wheelchair or bedside chair)?: None Help needed to walk in hospital room?: None Help needed climbing 3-5 steps with a railing? : None 6 Click Score: 24    End of Session Equipment Utilized During Treatment: Back brace;Other (comment)(L UE sling) Activity Tolerance: Patient limited by pain Patient left: in bed;with call bell/phone within reach;with restraints reapplied;with nursing/sitter in room;Other (comment)(guard in room as well )   PT Visit Diagnosis: Muscle weakness (generalized) (M62.81);Difficulty in walking, not elsewhere classified (R26.2);Pain Pain - Right/Left: Left Pain - part of body: Shoulder     Time: 4536-4680 PT Time Calculation (min) (ACUTE ONLY): 13 min  Charges:  $Gait Training: 8-22 mins                     Jerry Finley PT, DPT Acute Rehabilitation Services Pager [phone] Office [phone]    Jerry Finley Fleet 11/25/2018, 2:16 PM

## 2018-11-25 NOTE — Progress Notes (Addendum)
[facility] Progress Note     Subjective: CC-  Patient sitting up in bed eating breakfast. Sore, but overall pain well controlled. Complains of pain in back and left shoulder. Denies n/t of BUE/BLE. Denies abdominal pain. Tolerating diet. Last BM two days ago. Did well with PT yesterday, cleared for discharge with no follow up. Cleared by psychiatrist.  Objective: Vital signs in last 24 hours: Temp:  [98.2 F (36.8 C)-98.4 F (36.9 C)] 98.2 F (36.8 C) (03/16 2309) Pulse Rate:  [63-80] 63 (03/16 2309) Resp:  [15-18] 15 (03/16 2309) BP: (125-143)/(83-87) 127/83 (03/16 2309) SpO2:  [100 %] 100 % (03/16 2309) Last BM Date: 11/23/18  Intake/Output from previous day: 03/16 0701 - 03/17 0700 In: 240 [P.O.:240] Out: -  Intake/Output this shift: No intake/output data recorded.  PE: Gen:  Alert, NAD HEENT: EOM's intact, pupils equal and round Card:  RRR, 2+ DP pulses bilaterally Pulm:  CTAB, no W/R/R, effort normal Abd: Soft, NT/ND, +BS, no HSM Ext:  Calves soft and nontender. No gross sensory or motor deficits BUE/BLE. Moderate swelling and ecchymosis left shoulder Psych: A&Ox3  Skin: no rashes noted, warm and dry  Lab Results:  Recent Labs    11/23/18 0632 11/24/18 0541  WBC 10.0 10.1  HGB 11.0* 11.5*  HCT 32.9* 33.0*  PLT 209 228   BMET Recent Labs    11/23/18 0632 11/24/18 0541  NA 140 139  K 4.1 3.7  CL 110 108  CO2 23 24  GLUCOSE 80 116*  BUN 14 8  CREATININE 0.87 0.62  CALCIUM 8.2* 8.6*   PT/INR No results for input(s): LABPROT, INR in the last 72 hours. CMP     Component Value Date/Time   NA 139 11/24/2018 0541   K 3.7 11/24/2018 0541   CL 108 11/24/2018 0541   CO2 24 11/24/2018 0541   GLUCOSE 116 (H) 11/24/2018 0541   BUN 8 11/24/2018 0541   CREATININE 0.62 11/24/2018 0541   CREATININE 0.88 03/04/2017 1417   CALCIUM 8.6 (L) 11/24/2018 0541   PROT 5.6 (L) 11/23/2018 0632   ALBUMIN 3.2 (L) 11/23/2018 0632   AST 85 (H)  11/23/2018 0632   ALT 30 11/23/2018 0632   ALKPHOS 74 11/23/2018 0632   BILITOT 1.2 11/23/2018 0632   GFRNONAA >60 11/24/2018 0541   GFRNONAA >89 03/04/2017 1417   GFRAA >60 11/24/2018 0541   GFRAA >89 03/04/2017 1417   Lipase     Component Value Date/Time   LIPASE 24 03/12/2018 2109       Studies/Results: No results found.  Anti-infectives: Anti-infectives (From admission, onward)   Start     Dose/Rate Route Frequency Ordered Stop   11/24/18 1700  bictegravir-emtricitabine-tenofovir AF (BIKTARVY) 50-200-25 MG per tablet 1 tablet     1 tablet Oral Daily 11/24/18 1605         Assessment/Plan Jump from 2nd floor in jail Acute respiratory failure - doing well since extubation, resolved L clavicle and scapula FXs - per Dr. Susa Simmonds, NWB LUE in sling, f/u 2 weeks L rib FX 1.3.5 with apical PTX - PTX resolved on CXR T5,6,12 and L2 FXs - TLSO per Dr. Yetta Barre Current methadone treatment - 55mg  qd Depression - cleared by Dr. Sharma Covert for discharge, restarted on prozac 20mg  qd. F/u [facility] [place]  FEN - reg diet, SL IV VTE - Lovenox Foley - none Follow up - Emilia Beck, psych Dispo - OT consult pending. Plan for discharge to jail after therapy.  LOS: 3 days    Franne Forts , [facility] 11/25/2018, 8:10 AM Pager: [phone] Mon-Thurs 7:00 am-4:30 pm Fri 7:00 am -11:30 AM Sat-Sun 7:00 am-11:30 am

## 2018-11-25 NOTE — Discharge Instructions (Signed)
Wear back brace (TLSO) when out of bed. Ok to wear in bed for comfort. Wear shoulder sling for comfort. You are nonweightbearing to your left upper extremity   HOME INSTRUCTIONS   1. PAIN CONTROL:  1. Pain is best controlled by a usual combination of three different methods TOGETHER:  i. Ice/Heat ii. Over the counter pain medication iii. Prescription pain medication 2. You may experience some swelling and bruising in area of broken bones. Ice packs or heating pads (30-60 minutes up to 6 times a day) will help. Use ice for the first few days to help decrease swelling and bruising, then switch to heat to help relax tight/sore spots and speed recovery. Some people prefer to use ice alone, heat alone, alternating between ice & heat. Experiment to what works for you. Swelling and bruising can take several weeks to resolve.  3. It is helpful to take an over-the-counter pain medication regularly for the first few weeks. Choose one of the following that works best for you:  i. Naproxen (Aleve, etc) Two 220mg  tabs twice a day ii. Ibuprofen (Advil, etc) Three 200mg  tabs four times a day (every meal & bedtime) iii. Acetaminophen (Tylenol, etc) 500-650mg  four times a day (every meal & bedtime) 4. A prescription for pain medication (such as oxycodone, hydrocodone, etc) may be given to you upon discharge. Take your pain medication as prescribed.  i. If you are having problems/concerns with the prescription medicine (does not control pain, nausea, vomiting, rash, itching, etc), please call us [phone] to see if we need to switch you to a different pain medicine that will work better for you and/or control your side effect better. ii. If you need a refill on your pain medication, please contact your pharmacy. They will contact our office to request authorization. Prescriptions will not be filled after 5 pm or on week-ends. 1. Avoid getting constipated. When taking pain medications, it is common to  experience some constipation. Increasing fluid intake and taking a fiber supplement (such as Metamucil, Citrucel, FiberCon, MiraLax, etc) 1-2 times a day regularly will usually help prevent this problem from occurring. A mild laxative (prune juice, Milk of Magnesia, MiraLax, etc) should be taken according to package directions if there are no bowel movements after 48 hours.  2. Watch out for diarrhea. If you have many loose bowel movements, simplify your diet to bland foods & liquids for a few days. Stop any stool softeners and decrease your fiber supplement. Switching to mild anti-diarrheal medications (Kayopectate, Pepto Bismol) can help. If this worsens or does not improve, please call us. 3. FOLLOW UP  a. If a follow up appointment is needed one will be scheduled for you. If none is needed with our trauma team, please follow up with your primary care provider within 2-3 weeks from discharge. Please call [facility] at [phone] if you have any questions about follow up.  b. If you have any orthopedic or other injuries you will need to follow up as outlined in your follow up instructions.   WHEN TO CALL us [phone]:  1. Poor pain control 2. Reactions / problems with new medications (rash/itching, nausea, etc)  3. Fever over 101.5 F (38.5 C) 4. Worsening swelling or bruising 5. Worsening pain, productive cough, difficulty breathing or any other concerning symptoms  The clinic staff is available to answer your questions during regular business hours (8:30am-5pm). Please dont hesitate to call and ask to speak to one of our nurses for clinical concerns.  If you have a medical emergency, go to the nearest emergency room or call 911.  A surgeon from [facility] is always on call at the [facility], [place]  [address], [place], [place] [address] ?  MAIN: [phone] ? TOLL FREE: [phone] ?  FAX [phone]    [email or web address]

## 2018-11-25 NOTE — Progress Notes (Signed)
      INFECTIOUS DISEASE ATTENDING ADDENDUM:   Date: 11/25/2018  Patient name: Jerry Finley  Medical record number: [id number]  Date of birth: 10/20/1995   Was alerted to patients presence by + HIV test.  He is well controlled taking BIKTARVY for his HIV   HOwever he is RELIANT on HIV MEDICATION ASSISTANCE PROGRAM for medications  I am reaching out to Jerry Finley from [facility] to see if the patient can be re-enrolled into the program for the April through September period (otherwiwse the program will expire in at the end of the month)  Per Mitch the [employer] CAN renew HMAP and hopefully they are or have done this.  He and Con Memos will investigate in meantime status of HMAP     Acey Lav 11/25/2018, 9:01 AM

## 2018-11-25 NOTE — Evaluation (Signed)
Occupational Therapy Evaluation Patient Details Name: Jerry Finley MRN: [id number] DOB: [birth year] Today's Date: 11/25/2018    History of Present Illness 23 y.o. male admitted on 11/22/18 from jail after jumppting from the second story.  Pt injuries are as follows: acute resp failure requiring intubation from 3/14-3/15/20, L clavicle and L scapula fx (sling), L righ fxs 1, 3, 5, and PTX.  T5, 6, 12 and L2 fxs (TLSO when up), continued methadone treatment, and psych consult pending as he intended to harm himself.  Pt with other significant PMH of HIV, concussion, IV drug use, and hand surgery.     Clinical Impression   Patient evaluated by Occupational Therapy with no further acute OT needs identified. All education has been completed and the patient has no further questions. See below for any follow-up Occupational Therapy or equipment needs. OT to sign off. Thank you for referral.      Follow Up Recommendations  No OT follow up    Equipment Recommendations  None recommended by OT    Recommendations for Other Services       Precautions / Restrictions Precautions Precautions: Back Precaution Booklet Issued: Yes (comment) Precaution Comments: back precautions reviewed as well as when to wear the brace and how to don/doff Required Braces or Orthoses: Sling;Spinal Brace Spinal Brace: Thoracolumbosacral orthotic;Other (comment)(when up) Restrictions Weight Bearing Restrictions: Yes LUE Weight Bearing: Non weight bearing      Mobility Bed Mobility Overal bed mobility: Modified Independent                Transfers Overall transfer level: Modified independent                    Balance Overall balance assessment: Needs assistance Sitting-balance support: Feet supported;No upper extremity supported Sitting balance-Leahy Scale: Good     Standing balance support: Single extremity supported;No upper extremity supported Standing balance-Leahy Scale: Good                              ADL either performed or assessed with clinical judgement   ADL Overall ADL's : Modified independent                                       General ADL Comments: pt able to don bil socks. able to don back brace supervision. pt educated on don doff of sling mod i. pt needs cues to maintain back precautions with bending. pt able to figure 4 cross to maintain.      Vision Patient Visual Report: No change from baseline       Perception     Praxis      Pertinent Vitals/Pain Pain Assessment: Faces Faces Pain Scale: Hurts a little bit Pain Location: L clavicle the most Pain Descriptors / Indicators: Aching;Burning Pain Intervention(s): Monitored during session     Hand Dominance Right   Extremity/Trunk Assessment Upper Extremity Assessment Upper Extremity Assessment: LUE deficits/detail LUE Deficits / Details: sling NWB       Cervical / Trunk Assessment Cervical / Trunk Assessment: Other exceptions Cervical / Trunk Exceptions: multiple spine fxs   Communication Communication Communication: No difficulties   Cognition Arousal/Alertness: Awake/alert Behavior During Therapy: WFL for tasks assessed/performed Overall Cognitive Status: Within Functional Limits for tasks assessed(not specifically tested)  General Comments       Exercises Exercises: Other exercises Other Exercises Other Exercises: since pt unable to have IS, PT educated in deep breathing technique for increased lung capacity as well as anxiety reduction technique   Shoulder Instructions      Home Living Family/patient expects to be discharged to:: Other (Comment)                                 Additional Comments: jail      Prior Functioning/Environment Level of Independence: Independent                 OT Problem List:        OT Treatment/Interventions:      OT  Goals(Current goals can be found in the care plan section) Acute Rehab OT Goals Patient Stated Goal: to walk  OT Frequency:     Barriers to D/C:            Co-evaluation              AM-PAC OT "6 Clicks" Daily Activity     Outcome Measure Help from another person eating meals?: None Help from another person taking care of personal grooming?: None Help from another person toileting, which includes using toliet, bedpan, or urinal?: None Help from another person bathing (including washing, rinsing, drying)?: None Help from another person to put on and taking off regular upper body clothing?: None Help from another person to put on and taking off regular lower body clothing?: None 6 Click Score: 24   End of Session Nurse Communication: Mobility status;Precautions  Activity Tolerance: Patient tolerated treatment well Patient left: Other (comment)(up with PT beth)  OT Visit Diagnosis: Unsteadiness on feet (R26.81)                Time: 3212-2482 OT Time Calculation (min): 10 min Charges:  OT General Charges $OT Visit: 1 Visit OT Evaluation $OT Eval Low Complexity: 1 Low   Mateo Flow, OTR/L  Acute Rehabilitation Services Pager: [phone] Office: [phone] .   Mateo Flow 11/25/2018, 2:34 PM

## 2018-11-25 NOTE — Progress Notes (Signed)
Patient currently remains on his outpatient Biktarvy regimen. Last CD4 count in 05/2018 was 740 and HIV VL 50 this admission. Continue on Biktarvy.  Thank you for involving pharmacy in this patient's care.  Wendelyn Breslow, PharmD PGY1 Pharmacy Resident Phone: [phone] 11/25/2018 9:19 AM

## 2018-11-26 ENCOUNTER — Encounter (HOSPITAL_COMMUNITY): Payer: Self-pay

## 2018-11-26 LAB — HEPATITIS C ANTIBODY: HCV Ab: <0.1 s/co ratio (ref 0.0–0.9)

## 2018-11-26 LAB — RPR: RPR Ser Ql: NONREACTIVE

## 2018-11-26 MED ORDER — WHITE PETROLATUM EX OINT
TOPICAL_OINTMENT | CUTANEOUS | Status: AC
  Administered 2018-11-26: 19:00:00
  Filled 2018-11-26: qty 28.35

## 2018-11-26 NOTE — Progress Notes (Signed)
Patient stated that his IV in his left forearm fell out. He put catheter in the sharps bin so I was unable to view it when it came out. The site on his left forearm looked unremarkable with no bleeding.

## 2018-11-26 NOTE — Progress Notes (Signed)
[facility] Progress Note     Subjective: CC-  No new complaints. Left shoulder and back still sore, but overall pain well controlled. Denies BUE/BLE weakness or n/t. Tolerating diet. BM yesterday. Working well with therapies. Recommending no PT/OT follow up at discharge.  Due to brace and sling patient will require bed in safe keeping area at jail. Awaiting confirmation from jail that he can be discharged today.  Objective: Vital signs in last 24 hours: Temp:  [97.7 F (36.5 C)-98 F (36.7 C)] 97.7 F (36.5 C) (03/18 0525) Pulse Rate:  [66-72] 72 (03/18 0525) Resp:  [17-18] 18 (03/18 0525) BP: (134-142)/(64-84) 134/64 (03/18 0525) SpO2:  [98 %-100 %] 98 % (03/18 0525) Last BM Date: 11/23/18  Intake/Output from previous day: 03/17 0701 - 03/18 0700 In: 1560 [P.O.:1560] Out: -  Intake/Output this shift: No intake/output data recorded.  PE: Gen:  Alert, NAD HEENT: EOM's intact, pupils equal and round Card:  RRR, 2+ DP pulses bilaterally Pulm:  CTAB, no W/R/R, effort normal Abd: Soft, NT/ND, +BS, no HSM Ext:  Calves soft and nontender. No gross sensory or motor deficits BUE/BLE. Moderate swelling and ecchymosis left shoulder Psych: A&Ox3  Skin: no rashes noted, warm and dry  Lab Results:  Recent Labs    11/24/18 0541  WBC 10.1  HGB 11.5*  HCT 33.0*  PLT 228   BMET Recent Labs    11/24/18 0541  NA 139  K 3.7  CL 108  CO2 24  GLUCOSE 116*  BUN 8  CREATININE 0.62  CALCIUM 8.6*   PT/INR No results for input(s): LABPROT, INR in the last 72 hours. CMP     Component Value Date/Time   NA 139 11/24/2018 0541   K 3.7 11/24/2018 0541   CL 108 11/24/2018 0541   CO2 24 11/24/2018 0541   GLUCOSE 116 (H) 11/24/2018 0541   BUN 8 11/24/2018 0541   CREATININE 0.62 11/24/2018 0541   CREATININE 0.88 03/04/2017 1417   CALCIUM 8.6 (L) 11/24/2018 0541   PROT 5.6 (L) 11/23/2018 0632   ALBUMIN 3.2 (L) 11/23/2018 0632   AST 85 (H) 11/23/2018 0632   ALT  30 11/23/2018 0632   ALKPHOS 74 11/23/2018 0632   BILITOT 1.2 11/23/2018 0632   GFRNONAA >60 11/24/2018 0541   GFRNONAA >89 03/04/2017 1417   GFRAA >60 11/24/2018 0541   GFRAA >89 03/04/2017 1417   Lipase     Component Value Date/Time   LIPASE 24 03/12/2018 2109       Studies/Results: No results found.  Anti-infectives: Anti-infectives (From admission, onward)   Start     Dose/Rate Route Frequency Ordered Stop   11/24/18 1700  bictegravir-emtricitabine-tenofovir AF (BIKTARVY) 50-200-25 MG per tablet 1 tablet     1 tablet Oral Daily 11/24/18 1605         Assessment/Plan Jump from 2nd floor in jail Acute respiratory failure- doing well since extubation, resolved L clavicle and scapula FXs - per Dr. Susa Simmonds, NWB LUE in sling, f/u 2 weeks L rib FX 1.3.5 with apical PTX- PTX resolved on CXR, continue pain control and pulmonary toilet T5,6,12 and L2 FXs- TLSO per Dr. 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 Sharma Covert for discharge, restarted on prozac 20mg  qd. F/u [facility] [place]  FEN- reg diet, SL IV VTE- Lovenox Foley - none Follow up - Emilia Beck, psych Dispo- Patient medically stable for discharge to jail. Awaiting notification from jail that they have space in the  safe keeping area for him.   LOS: 4 days    Franne Forts , [facility] 11/26/2018, 7:54 AM Pager: [phone] Mon-Thurs 7:00 am-4:30 pm Fri 7:00 am -11:30 AM Sat-Sun 7:00 am-11:30 am

## 2018-11-26 NOTE — Discharge Summary (Signed)
[facility] Discharge Summary   Patient ID: Jerry Finley MRN: [id number] DOB/AGE: 05/24/1996 23 y.o.  Admit date: 11/22/2018 Discharge date: 11/27/2018  Admitting Diagnosis: Jump from second story  Left clavicle/ Left scapula fracture Multiple Left ribs w/ small L PTX T5,6,12 and L2 FXs Current methadone treatment Depression Possible concussion  HIV Polysubstance abuse   Discharge Diagnosis Patient Active Problem List   Diagnosis Date Noted  . Adjustment disorder with mixed disturbance of emotions and conduct   . Multiple trauma 11/22/2018  . Acute upper respiratory infection 06/09/2018  . Elevated liver enzymes 06/09/2018  . Cocaine abuse with cocaine-induced mood disorder (HCC) 04/02/2018  . Smoker 11/20/2016  . Concussion with loss of consciousness 06/25/2016  . Nausea with vomiting 06/25/2016  . 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 Legal problem 04/09/2016  . HIV disease (HCC) 03/20/2016    Consultants Orthopedics Neurosurgery Psychiatry  Imaging: No results found.  Procedures None  Hospital Course:  Jerry Finley is a 23yo male PMH HIV, polysubstance abuse, currently on methadone treatment, and depression with suicidal ideation, who was brought from jail to [facility] 3/14 after jumping from a second story and landing on a table striking his head. Initially was a non-activation and upgraded to level 1 for GCS < 9. Patient intermittently screaming that his back is broken and then becomes unresponsive. Decision made to intubate for further workup and airway protection.  Workup showed Left clavicle fracture, Left scapula fracture, Multiple left rib fractures, Tiny apical left pneumothorax, L2 fracture, and T5/6/12 fractures. Patient was admitted to the trauma ICU, intubated. Follow up chest xray showed resolution of pneumothorax. Orthopedics was consulted for LUE fractures and recommended nonoperative management, nonweightbearing  left upper extremity in sling as needed for comfort. Neurosurgery was consulted for spine fractures and recommended nonoperative management in TLSO. Psychiatry was consulted for suicidal ideation and cleared the patient; recommend prozac 20mg  qd and outpatient follow up. Patient was successfully extubated 3/15. Patient worked with therapies during this admission. On 3/19, the patient was voiding well, tolerating diet, mobilizing well, pain well controlled, vital signs stable and felt stable for discharge to jail.  Patient will follow up as below and knows to call with questions or concerns.   Physical Exam: Gen: Alert, NAD HEENT: pupils equal and round Card: RRR, 2+ PT pulses bilaterally Pulm: CTAB, no W/R/R, rate and effort normal Ext:No BLE edema. sling left shoulder, TLSO brace  Neuro: No gross sensory or motor deficits BUE/BLE. Psych: A&Ox3  Skin: no rashes noted, warm and dry  >> physical exam per Mattie Marlin PA 11/27/2018  Allergies as of 11/27/2018   No Known Allergies     Medication List    TAKE these medications   acetaminophen 325 MG tablet Commonly known as:  TYLENOL Take 2 tablets (650 mg total) by mouth every 4 (four) hours as needed for mild pain.   bictegravir-emtricitabine-tenofovir AF 50-200-25 MG Tabs tablet Commonly known as:  Biktarvy Take 1 tablet by mouth daily.   docusate sodium 100 MG capsule Commonly known as:  COLACE Take 1 capsule (100 mg total) by mouth 2 (two) times daily.   FLUoxetine 20 MG capsule Commonly known as:  PROZAC Take 1 capsule (20 mg total) by mouth daily.   gabapentin 600 MG tablet Commonly known as:  NEURONTIN Take 0.5 tablets (300 mg total) by mouth 3 (three) times daily.   ibuprofen 600 MG tablet Commonly known as:  ADVIL,MOTRIN Take 1 tablet (600 mg  total) by mouth every 6 (six) hours as needed (pain not controlled with tylenol).   methadone 1 MG/1ML solution Commonly known as:  DOLOPHINE Take 55 mg by mouth daily.    oxyCODONE 5 MG immediate release tablet Commonly known as:  Oxy IR/ROXICODONE Take 1 tablet (5 mg total) by mouth every 6 (six) hours as needed (pain not controlled with ibuprofen).        Follow-up Information    [employer] Call.   Specialty:  Professional Counselor Why:  call to arrange follow up regarding depression and further medication management Contact information: [address] [place] [address] [phone]        Terance Hart, MD. Schedule an appointment as soon as possible for a visit in 2 week(s).   Specialty:  Orthopedic Surgery Why:  call to arrange follow up regarding shoulder injury Contact information: [address] [place] [address] [phone]        Tia Alert, MD. Schedule an appointment as soon as possible for a visit in 4 week(s).   Specialty:  Neurosurgery Why:  call to arrange follow up regarding spine fractures Contact information: [address] [place] [address] [phone]        [facility]. Call.   Why:  as needed, you do not have to schedule an appointment Contact information: [address] [place] [address] [phone]       Veryl Speak, FNP. Call.   Specialties:  Family Medicine, Infectious Diseases Why:  call to arrange follow up regarding HIV medications Contact information: [address] [place] [address] [place] [address] [phone]           Signed: Franne Forts, [facility] 11/27/2018, 9:22 AM Pager: [phone] Mon-Thurs 7:00 am-4:30 pm Fri 7:00 am -11:30 AM Sat-Sun 7:00 am-11:30 am

## 2018-11-27 MED ORDER — IBUPROFEN 600 MG PO TABS: 600.0000 mg | ORAL_TABLET | Freq: Four times a day (QID) | ORAL | 0 refills | Status: DC | PRN

## 2018-11-27 MED ORDER — DOCUSATE SODIUM 100 MG PO CAPS: 100.0000 mg | ORAL_CAPSULE | Freq: Two times a day (BID) | ORAL | 0 refills | Status: DC

## 2018-11-27 MED ORDER — FLUOXETINE HCL 20 MG PO CAPS: 20.0000 mg | ORAL_CAPSULE | Freq: Every day | ORAL | 3 refills | Status: DC

## 2018-11-27 MED ORDER — OXYCODONE HCL 5 MG PO TABS: 5.0000 mg | ORAL_TABLET | Freq: Four times a day (QID) | ORAL | 0 refills | Status: DC | PRN

## 2018-11-27 MED ORDER — GABAPENTIN 600 MG PO TABS: 300.0000 mg | ORAL_TABLET | Freq: Three times a day (TID) | ORAL | Status: DC

## 2018-11-27 MED ORDER — OXYCODONE HCL 5 MG PO TABS: 5.0000 mg | ORAL_TABLET | Freq: Four times a day (QID) | ORAL | Status: DC | PRN

## 2018-11-27 MED ORDER — ACETAMINOPHEN 325 MG PO TABS: 650.0000 mg | ORAL_TABLET | ORAL | Status: DC | PRN

## 2018-11-27 NOTE — Progress Notes (Signed)
Discharged pt to another facility accompanied by Police officers. Pt alert, oriented and stable. Discharged papers given to Deputy officer

## 2018-11-27 NOTE — Progress Notes (Signed)
[facility]/Trauma Progress Note      Assessment/Plan Jump from 2nd floor in jail Acute respiratory failure- resolved L clavicle and scapula FXs - per Dr. Susa Simmonds, NWB LUE in sling, f/u 2 weeks L rib FX 1.3.5 with apical PTX- PTX resolved on CXR, pain control and pulmonary toilet T5,6,12 and L2 FXs- TLSO per Dr. Yetta Barre Current methadone treatment-55mg  qd Depression-cleared by Dr. Sharma Covert for discharge, restarted on prozac 20mg  qd. [facility]  FEN- reg diet, SL IV VTE- Lovenox Foley - none Follow up - Emilia Beck, psych Dispo-Patient medically stable for discharge to jail. Awaiting notification from jail that they have space in the safe keeping area for him.   LOS: 5 days    Subjective: CC: Not sleeping well  No issues overnight. No fever, chills, or cough. No numbness or tingling. Physicist, medical at bedside.   Objective: Vital signs in last 24 hours: Temp:  [97.9 F (36.6 C)-98.8 F (37.1 C)] 97.9 F (36.6 C) (03/19 0500) Pulse Rate:  [62-82] 62 (03/19 0500) Resp:  [18-20] 20 (03/19 0500) BP: (122-145)/(74-95) 122/82 (03/19 0500) SpO2:  [98 %-100 %] 100 % (03/19 0500) Last BM Date: 11/23/18  Intake/Output from previous day: 03/18 0701 - 03/19 0700 In: 1440 [P.O.:1440] Out: -  Intake/Output this shift: No intake/output data recorded.  PE: Gen: Alert, NAD HEENT: pupils equal and round Card: RRR, 2+ PT pulses bilaterally Pulm: CTAB, no W/R/R, rate and effort normal Ext:No BLE edema. sling left shoulder, TLSO brace  Neuro: No gross sensory or motor deficits BUE/BLE. Psych: A&Ox3  Skin: no rashes noted, warm and dry   Anti-infectives: Anti-infectives (From admission, onward)   Start     Dose/Rate Route Frequency Ordered Stop   11/24/18 1700  bictegravir-emtricitabine-tenofovir AF (BIKTARVY) 50-200-25 MG per tablet 1 tablet     1 tablet Oral Daily 11/24/18 1605        Lab Results:  No results for  input(s): WBC, HGB, HCT, PLT in the last 72 hours. BMET No results for input(s): NA, K, CL, CO2, GLUCOSE, BUN, CREATININE, CALCIUM in the last 72 hours. PT/INR No results for input(s): LABPROT, INR in the last 72 hours. CMP     Component Value Date/Time   NA 139 11/24/2018 0541   K 3.7 11/24/2018 0541   CL 108 11/24/2018 0541   CO2 24 11/24/2018 0541   GLUCOSE 116 (H) 11/24/2018 0541   BUN 8 11/24/2018 0541   CREATININE 0.62 11/24/2018 0541   CREATININE 0.88 03/04/2017 1417   CALCIUM 8.6 (L) 11/24/2018 0541   PROT 5.6 (L) 11/23/2018 0632   ALBUMIN 3.2 (L) 11/23/2018 0632   AST 85 (H) 11/23/2018 0632   ALT 30 11/23/2018 0632   ALKPHOS 74 11/23/2018 0632   BILITOT 1.2 11/23/2018 0632   GFRNONAA >60 11/24/2018 0541   GFRNONAA >89 03/04/2017 1417   GFRAA >60 11/24/2018 0541   GFRAA >89 03/04/2017 1417   Lipase     Component Value Date/Time   LIPASE 24 03/12/2018 2109    Studies/Results: No results found.    Jerry Finley , [facility] 11/27/2018, 8:02 AM  Pager: [phone] Mon-Wed, Friday 7:00am-4:30pm Thurs 7am-11:30am  Consults: [phone]

## 2019-01-22 ENCOUNTER — Encounter (HOSPITAL_COMMUNITY): Payer: Self-pay | Admitting: Emergency Medicine

## 2019-01-22 ENCOUNTER — Emergency Department (HOSPITAL_COMMUNITY)
Admission: EM | Admit: 2019-01-22 | Discharge: 2019-01-23 | Disposition: A | Discharge status: Home / Self Care | Payer: Medicaid Other | Attending: Emergency Medicine | Admitting: Emergency Medicine

## 2019-01-22 DIAGNOSIS — F1910 Other psychoactive substance abuse, uncomplicated: Secondary | ICD-10-CM

## 2019-01-22 DIAGNOSIS — Z79899 Other long term (current) drug therapy: Secondary | ICD-10-CM | POA: Insufficient documentation

## 2019-01-22 DIAGNOSIS — T426X1A Poisoning by other antiepileptic and sedative-hypnotic drugs, accidental (unintentional), initial encounter: Secondary | ICD-10-CM | POA: Insufficient documentation

## 2019-01-22 DIAGNOSIS — F17210 Nicotine dependence, cigarettes, uncomplicated: Secondary | ICD-10-CM | POA: Insufficient documentation

## 2019-01-22 DIAGNOSIS — Z21 Asymptomatic human immunodeficiency virus [HIV] infection status: Secondary | ICD-10-CM | POA: Insufficient documentation

## 2019-01-22 DIAGNOSIS — R000 Tachycardia, unspecified: Secondary | ICD-10-CM | POA: Insufficient documentation

## 2019-01-22 DIAGNOSIS — T50901A Poisoning by unspecified drugs, medicaments and biological substances, accidental (unintentional), initial encounter: Secondary | ICD-10-CM

## 2019-01-22 LAB — SALICYLATE LEVEL: Salicylate Lvl: <7 mg/dL (ref 2.8–30.0)

## 2019-01-22 LAB — CBC WITH DIFFERENTIAL/PLATELET
Abs Immature Granulocytes: 0.02 10*3/uL (ref 0.00–0.07)
Basophils Absolute: 0 10*3/uL (ref 0.0–0.1)
Basophils Relative: 1 %
Eosinophils Absolute: 0.1 10*3/uL (ref 0.0–0.5)
Eosinophils Relative: 2 %
HCT: 40.9 % (ref 39.0–52.0)
Hemoglobin: 13.8 g/dL (ref 13.0–17.0)
Immature Granulocytes: 0 %
Lymphocytes Relative: 35 %
Lymphs Abs: 2.3 10*3/uL (ref 0.7–4.0)
MCH: 31.5 pg (ref 26.0–34.0)
MCHC: 33.7 g/dL (ref 30.0–36.0)
MCV: 93.4 fL (ref 80.0–100.0)
Monocytes Absolute: 0.5 10*3/uL (ref 0.1–1.0)
Monocytes Relative: 8 %
Neutro Abs: 3.6 10*3/uL (ref 1.7–7.7)
Neutrophils Relative %: 54 %
Platelets: 316 10*3/uL (ref 150–400)
RBC: 4.38 MIL/uL (ref 4.22–5.81)
RDW: 13.2 % (ref 11.5–15.5)
WBC: 6.6 10*3/uL (ref 4.0–10.5)
nRBC: 0 % (ref 0.0–0.2)

## 2019-01-22 LAB — COMPREHENSIVE METABOLIC PANEL
ALT: 21 U/L (ref 0–44)
AST: 26 U/L (ref 15–41)
Albumin: 4.1 g/dL (ref 3.5–5.0)
Alkaline Phosphatase: 211 U/L — ABNORMAL HIGH (ref 38–126)
Anion gap: 9 (ref 5–15)
BUN: 12 mg/dL (ref 6–20)
CO2: 26 mmol/L (ref 22–32)
Calcium: 8.9 mg/dL (ref 8.9–10.3)
Chloride: 105 mmol/L (ref 98–111)
Creatinine, Ser: 0.75 mg/dL (ref 0.61–1.24)
GFR calc Af Amer: >60 mL/min (ref >60)
GFR calc non Af Amer: >60 mL/min (ref >60)
Glucose, Bld: 105 mg/dL — ABNORMAL HIGH (ref 70–99)
Potassium: 3.9 mmol/L (ref 3.5–5.1)
Sodium: 140 mmol/L (ref 135–145)
Total Bilirubin: 0.5 mg/dL (ref 0.3–1.2)
Total Protein: 7.6 g/dL (ref 6.5–8.1)

## 2019-01-22 LAB — ETHANOL: Alcohol, Ethyl (B): <10 mg/dL

## 2019-01-22 LAB — ACETAMINOPHEN LEVEL: Acetaminophen (Tylenol), Serum: 13 ug/mL (ref 10–30)

## 2019-01-22 MED ORDER — SODIUM CHLORIDE 0.9 % IV BOLUS
1000.0000 mL | Freq: Once | INTRAVENOUS | Status: AC
  Administered 2019-01-22: 1000 mL via INTRAVENOUS

## 2019-01-22 NOTE — ED Triage Notes (Signed)
Patient was brought in by [facility] from a residential location. According to EMS, patient has overdose on unknown amount of Gabapentin 300 mg, possibly a quarter of the bottle, and snorted unknown substance that he believes is opioids around 19:20-19:40. Patient was administered Narcan 1 mg. EMS did use non-re breather. However, when patient arrived he was alert, oriented x 4, and able to get off of EMS stretcher to our stretcher with no assistance.

## 2019-01-22 NOTE — ED Provider Notes (Signed)
[facility]-EMERGENCY DEPT Provider Note   CSN: [id number] Arrival date & time: 01/22/19  2033    History   Chief Complaint Chief Complaint  Patient presents with  . Drug Overdose    HPI Jerry Finley is a 23 y.o. male.     The history is provided by the patient and medical records. No language interpreter was used.  Drug Overdose      23 year old male with significant history of polysubstance abuse including IV drug use, HIV, adjustment disorder brought here via EMS for suspect unintentional overdose.  Patient states he took approximately 7-10 300 mg gabapentin throughout the day today.  He also drank one 4-Loko drink, as well as using heroin and amphetamine in an attempt to get high.  He was at his grandparents house when he was found unresponsive.  Given currently EMS was contacted.  I believe that patient consumed the medication approximately 2 hours ago.  Patient did receive 1 mg of Narcan prior to arrival and EMS did have to use nonrebreather.  Once arrived, patient became alert and oriented and able to ambulate.  Patient denies any suicidal ideation but does endorse having some depression.  Denies any homicidal ideation auditory or visual hallucination.  The medication he took was not prescribed to him.  He denies any prior drug overdose in the past.  He admits that he has history of HIV and states that it is currently controlled with medication.  He denies any active pain aside from chronic back pain.  Past Medical History:  Diagnosis Date  . Concussion with loss of consciousness 06/25/2016  . 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 HIV (human immunodeficiency virus infection) (HCC)   . Legal problem 04/09/2016  . Nausea with vomiting 06/25/2016  . Smoker 11/20/2016    Patient Active Problem List   Diagnosis Date Noted  . Adjustment disorder with mixed disturbance of emotions and conduct   . Multiple trauma 11/22/2018  . Acute upper  respiratory infection 06/09/2018  . Elevated liver enzymes 06/09/2018  . Cocaine abuse with cocaine-induced mood disorder (HCC) 04/02/2018  . Smoker 11/20/2016  . Concussion with loss of consciousness 06/25/2016  . Nausea with vomiting 06/25/2016  . 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 Legal problem 04/09/2016  . HIV disease (HCC) 03/20/2016    Past Surgical History:  Procedure Laterality Date  . HAND SURGERY          Home Medications    Prior to Admission medications   Medication Sig Start Date End Date Taking? Authorizing Provider  acetaminophen (TYLENOL) 325 MG tablet Take 2 tablets (650 mg total) by mouth every 4 (four) hours as needed for mild pain. 11/27/18   Meuth, Brooke A, PA-C  bictegravir-emtricitabine-tenofovir AF (BIKTARVY) 50-200-25 MG TABS tablet Take 1 tablet by mouth daily. 06/09/18   Veryl Speakalone, Gregory D, FNP  docusate sodium (COLACE) 100 MG capsule Take 1 capsule (100 mg total) by mouth 2 (two) times daily. 11/27/18   Meuth, Brooke A, PA-C  FLUoxetine (PROZAC) 20 MG capsule Take 1 capsule (20 mg total) by mouth daily. 11/27/18   Meuth, Brooke A, PA-C  gabapentin (NEURONTIN) 600 MG tablet Take 0.5 tablets (300 mg total) by mouth 3 (three) times daily. 11/27/18   Meuth, Brooke A, PA-C  ibuprofen (ADVIL,MOTRIN) 600 MG tablet Take 1 tablet (600 mg total) by mouth every 6 (six) hours as needed (pain not controlled with tylenol). 11/27/18   Meuth, Lina SarBrooke A, PA-C  methadone (DOLOPHINE) 1 MG/1ML solution Take 55 mg by mouth daily.     [provider]  oxyCODONE (OXY IR/ROXICODONE) 5 MG immediate release tablet Take 1 tablet (5 mg total) by mouth every 6 (six) hours as needed (pain not controlled with ibuprofen). 11/27/18   Meuth, Lina Sar, PA-C    Family History Family History  Problem Relation Age of Onset  . Healthy Mother   . Healthy Father     Social History Social History   Tobacco Use  . Smoking status: Current Every Day Smoker    Packs/day:  1.00    Types: Cigarettes  . Smokeless tobacco: Never Used  Substance Use Topics  . Alcohol use: Yes    Comment: socially  . Drug use: Not Currently    Types: Cocaine, Marijuana, Methamphetamines     Allergies   Patient has no known allergies.   Review of Systems Review of Systems  All other systems reviewed and are negative.    Physical Exam Updated Vital Signs BP (!) 151/104 (BP Location: Right Arm)   Pulse (!) 111   Temp 98.4 F (36.9 C) (Oral)   Resp 12   Ht  (1.702 m)   Wt 59 kg   SpO2 100%   BMI 20.36 kg/m   Physical Exam Vitals signs and nursing note reviewed.  Constitutional:      General: He is not in acute distress.    Appearance: He is well-developed.  HENT:     Head: Atraumatic.  Eyes:     Conjunctiva/sclera: Conjunctivae normal.  Neck:     Musculoskeletal: Neck supple.  Cardiovascular:     Rate and Rhythm: Tachycardia present.     Pulses: Normal pulses.     Heart sounds: Normal heart sounds.  Pulmonary:     Effort: Pulmonary effort is normal.     Breath sounds: Normal breath sounds. No wheezing, rhonchi or rales.  Abdominal:     Palpations: Abdomen is soft.     Tenderness: There is no abdominal tenderness.  Skin:    Findings: No rash.  Neurological:     Mental Status: He is alert and oriented to person, place, and time.     GCS: GCS eye subscore is 4. GCS verbal subscore is 5. GCS motor subscore is 6.     Cranial Nerves: Cranial nerves are intact.     Sensory: Sensation is intact.     Motor: Motor function is intact.     Coordination: Coordination is intact.  Psychiatric:        Mood and Affect: Mood normal.        Speech: Speech normal.        Behavior: Behavior is cooperative.        Thought Content: Thought content does not include homicidal or suicidal ideation.      ED Treatments / Results  Labs (all labs ordered are listed, but only abnormal results are displayed) Labs Reviewed  COMPREHENSIVE METABOLIC PANEL -  Abnormal; Notable for the following components:      Result Value   Glucose, Bld 105 (*)    Alkaline Phosphatase 211 (*)    All other components within normal limits  ETHANOL  CBC WITH DIFFERENTIAL/PLATELET  ACETAMINOPHEN LEVEL  SALICYLATE LEVEL  RAPID URINE DRUG SCREEN, HOSP PERFORMED    EKG None  ED ECG REPORT   Date: 01/22/2019  Rate: 113  Rhythm: sinus tachycardia  QRS Axis: normal  Intervals: normal  ST/T Wave abnormalities: early repolarization  Conduction Disutrbances:none  Narrative Interpretation:   Old EKG Reviewed: none available  I have personally reviewed the EKG tracing and agree with the computerized printout as noted.   Radiology No results found.  Procedures Procedures (including critical care time)  Medications Ordered in ED Medications  sodium chloride 0.9 % bolus 1,000 mL (1,000 mLs Intravenous New Bag/Given 01/22/19 2142)     Initial Impression / Assessment and Plan / ED Course  I have reviewed the triage vital signs and the nursing notes.  Pertinent labs & imaging results that were available during my care of the patient were reviewed by me and considered in my medical decision making (see chart for details).        BP (!) 151/104 (BP Location: Right Arm)   Pulse (!) 111   Temp 98.4 F (36.9 C) (Oral)   Resp 12   Ht 5\' 7"  (1.702 m)   Wt 59 kg   SpO2 96%   BMI 20.36 kg/m    Final Clinical Impressions(s) / ED Diagnoses   Final diagnoses:  Acute drug overdose, accidental or unintentional, initial encounter    ED Discharge Orders    None     9:21 PM Patient brought here via EMS due to an intentional drug overdose which include gabapentin, amphetamine, energy drink, as well as heroin.  He did receive 1 mg of Narcan prior to arrival.  He is currently alert and oriented and in no acute discomfort.  Poison control has been contacted and was recommended to check Tylenol level, basic labs, EKG, give IV fluid.  They also recommend  recheck Tylenol level in 4 hours.  11:41 PM Pt sign out to [employer], PA-C who will recheck tylenol level after 4 hrs.  Pt would also benefit from outpt psychiatric evaluation given underlying depression.  Pt however denies HI/SI.     Fayrene Helper, PA-C 01/22/19 O[place] Sills    Arby Barrette, MD 02/04/19 [phone]

## 2019-01-22 NOTE — ED Notes (Addendum)
Spoke with [employer], suggest treatment is supportive care, EKG, Tylenol level 4 hours out (around midnight), CK, and CMP levels. Suggested adverse effects are slurred speech, hypotension, CNS depression, respiratory depression, and bradycardia. If hypotension, may administer IV fluids and if respiratory depression, given additional NARCAN.

## 2019-01-23 LAB — ACETAMINOPHEN LEVEL: Acetaminophen (Tylenol), Serum: <10 ug/mL — ABNORMAL LOW (ref 10–30)

## 2019-01-23 LAB — RAPID URINE DRUG SCREEN, HOSP PERFORMED
Amphetamines: POSITIVE — AB
Barbiturates: NOT DETECTED
Benzodiazepines: POSITIVE — AB
Cocaine: POSITIVE — AB
Opiates: NOT DETECTED
Tetrahydrocannabinol: NOT DETECTED

## 2019-01-23 NOTE — ED Provider Notes (Signed)
2:30 AM handoff from [place] at shift change.  Patient is awaiting repeat Tylenol level at 12:30 AM.  This returned less than 10.  I went and updated the patient on his results.  He is up in the room standing without any difficulty.  He states that he has had suicidal attempts in the past however this was not an attempt to harm himself.  He states that he has a child which she loves very much and that his previous attempt was a very bad decision.  He denies any active suicidal ideations.  Patient will be given outpatient resources.  He does have a counselor as an outpatient who can follow-up with as well.  Plan for discharge  home at this time.  BP 139/84 (BP Location: Right Arm)   Pulse 96   Temp 98.6 F (37 C) (Oral)   Resp 14   Ht 5\' 7"  (1.702 m)   Wt 59 kg   SpO2 98%   BMI 20.36 kg/m     Renne Crigler, PA-C 01/23/19 0231    Pricilla Loveless, MD 01/28/19 1420

## 2019-01-29 ENCOUNTER — Other Ambulatory Visit: Payer: Medicaid Other

## 2019-02-16 ENCOUNTER — Encounter: Payer: Medicaid Other | Admitting: Family

## 2019-03-09 ENCOUNTER — Telehealth: Payer: Self-pay | Admitting: Pharmacy Technician

## 2019-03-09 ENCOUNTER — Telehealth: Payer: Self-pay | Admitting: *Deleted

## 2019-03-09 NOTE — Telephone Encounter (Signed)
If the person that he was in contact has tested positive than we can send him for testing. If the person whom he was in contact with was negative, then there is low risk and no need for testing. I would not recommend testing until we know that answer.

## 2019-03-09 NOTE — Telephone Encounter (Signed)
Mr. Hinton has an appointment on 03/17/2019 to reapply for ADAP which has expired.  He has 1 1/2 weeks worth of Biktarvy and asked about patient assistance to bridge him until his ADAP is approved.  He has had patient assistance before through [employer] and needs to bring in 2 current paystubs to be approved.  He said that he will bring in the paystubs.  The application is printed and ready to fax in once the paystubs are supplied.  Venida Jarvis. Nadara Mustard [facility] for Infectious Disease Phone: [phone] Fax:  [phone]

## 2019-03-09 NOTE — Telephone Encounter (Signed)
Patient called to see if he could be given an order for covid testing. He states he was exposed through someone who works with someone who is confirmed covid.  He does not have any symptoms.   He is overdue for visit, labs.  Scheduled 7/7 for labs/visit/financial.  He let ADAP expire, was transferred to [facility] for patient assistance.   Please advise on covid testing. Landis Gandy, RN

## 2019-03-10 NOTE — Telephone Encounter (Signed)
RN called patient back this morning with Greg's advice, phone call was answered by a woman.  She asked for a message to pass on, RN asked her to have Hart Carwin call his doctor's office back.

## 2019-03-17 ENCOUNTER — Encounter: Payer: Self-pay | Admitting: Family

## 2019-03-17 ENCOUNTER — Other Ambulatory Visit: Payer: Self-pay

## 2019-03-17 ENCOUNTER — Ambulatory Visit (INDEPENDENT_AMBULATORY_CARE_PROVIDER_SITE_OTHER): Payer: Self-pay | Admitting: Family

## 2019-03-17 ENCOUNTER — Ambulatory Visit: Payer: Self-pay

## 2019-03-17 VITALS — BP 143/91 | HR 85 | Temp 98.3°F

## 2019-03-17 DIAGNOSIS — F4325 Adjustment disorder with mixed disturbance of emotions and conduct: Secondary | ICD-10-CM

## 2019-03-17 DIAGNOSIS — Z0000 Encounter for general adult medical examination without abnormal findings: Secondary | ICD-10-CM

## 2019-03-17 DIAGNOSIS — Z23 Encounter for immunization: Secondary | ICD-10-CM

## 2019-03-17 DIAGNOSIS — Z113 Encounter for screening for infections with a predominantly sexual mode of transmission: Secondary | ICD-10-CM

## 2019-03-17 DIAGNOSIS — B20 Human immunodeficiency virus [HIV] disease: Secondary | ICD-10-CM

## 2019-03-17 DIAGNOSIS — F17200 Nicotine dependence, unspecified, uncomplicated: Secondary | ICD-10-CM

## 2019-03-17 MED ORDER — ESCITALOPRAM OXALATE 20 MG PO TABS: 20.0000 mg | ORAL_TABLET | Freq: Every day | ORAL | 2 refills | Status: DC

## 2019-03-17 NOTE — Progress Notes (Signed)
Subjective:    Patient ID: Jerry Finley, male    DOB: Mar 17, 1996, 23 y.o.   MRN: [id number]  Chief Complaint  Patient presents with  . HIV Positive/AIDS     HPI:  Jerry Finley is a 23 y.o. male with HIV disease who was last seen in the office for follow-up on 06/09/2018 with good adherence and tolerance to his ART regimen of Biktarvy.  Viral load at that time was found to be 98 with CD4 count of 740.  It was recommended follow-up in 3 months for continued care which was not completed.  Since his last office visit he has been seen in the emergency room/urgent care on multiple occasions for mental health related issues as well as an acute drug overdose.  Pharmacy note on 6/29 showing his financial assistance expired at the end of March. Health care maintenance due includes Menveo.   Jerry Finley has been taking his Biktarvy as prescribed and ran out of medication yesterday. He had 2 leftover bottles at home. Overall feeling okay with some concerns about depression. Denies fevers, chills, night sweats, headaches, changes in vision, neck pain/stiffness, nausea, diarrhea, vomiting, lesions or rashes.  Jerry Finley has been working in [employer] and currently laid off due to [employer]onavirus. Has been sober for the last 5 months. Smoking tobacco daily and uses alcohol on occasional. Not currently sexually active. Denies suicidal ideations or psychosis at present. Lexapro has helped with his symptoms in the past and would like to restart.   No Known Allergies    Outpatient Medications Prior to Visit  Medication Sig Dispense Refill  . bictegravir-emtricitabine-tenofovir AF (BIKTARVY) 50-200-25 MG TABS tablet Take 1 tablet by mouth daily. 30 tablet 3  . Multiple Vitamins-Minerals (CENTRUM MEN) TABS Take 1 tablet by mouth daily.     No facility-administered medications prior to visit.      Past Medical History:  Diagnosis Date  . Concussion with loss of consciousness 06/25/2016  .  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 HIV (human immunodeficiency virus infection) (HCC)   . Legal problem 04/09/2016  . Nausea with vomiting 06/25/2016  . Smoker 11/20/2016     Past Surgical History:  Procedure Laterality Date  . HAND SURGERY         Review of Systems  Constitutional: Negative for appetite change, chills, fatigue, fever and unexpected weight change.  Eyes: Negative for visual disturbance.  Respiratory: Negative for cough, chest tightness, shortness of breath and wheezing.   Cardiovascular: Negative for chest pain and leg swelling.  Gastrointestinal: Negative for abdominal pain, constipation, diarrhea, nausea and vomiting.  Genitourinary: Negative for dysuria, flank pain, frequency, genital sores, hematuria and urgency.  Skin: Negative for rash.  Allergic/Immunologic: Negative for immunocompromised state.  Neurological: Negative for dizziness and headaches.      Objective:    BP (!) 143/91   Pulse 85   Temp 98.3 F (36.8 C)  Nursing note and vital signs reviewed.  Physical Exam Constitutional:      General: He is not in acute distress.    Appearance: He is well-developed.  Eyes:     Conjunctiva/sclera: Conjunctivae normal.  Neck:     Musculoskeletal: Neck supple.  Cardiovascular:     Rate and Rhythm: Normal rate and regular rhythm.     Heart sounds: Normal heart sounds. No murmur. No friction rub. No gallop.   Pulmonary:     Effort: Pulmonary effort is normal. No respiratory distress.  Breath sounds: Normal breath sounds. No wheezing or rales.  Chest:     Chest wall: No tenderness.  Abdominal:     General: Bowel sounds are normal.     Palpations: Abdomen is soft.     Tenderness: There is no abdominal tenderness.  Lymphadenopathy:     Cervical: No cervical adenopathy.  Skin:    General: Skin is warm and dry.     Findings: No rash.  Neurological:     Mental Status: He is alert and oriented to person, place, and time.   Psychiatric:        Behavior: Behavior normal.        Thought Content: Thought content normal.        Judgment: Judgment normal.        Assessment & Plan:   Problem List Items Addressed This Visit      Other   HIV disease ([place]) - Primary    Jerry Finley has questionable status of his HIV with good tolerance of his Biktarvy.  He indicates he is only missed 1 day of medication as he had 2 other bottles, however prescriptions have not been filled in several months and he has not been seen since November of last year and his financial assistance ended in April.  Discussed importance of taking medication as prescribed.  Fortunately he has no signs/symptoms of opportunistic infection or progressive HIV disease at present.  Financial assistance renewed today.  Pharmacy working on obtaining additional assistance to obtain medication.  Restart Biktarvy.  Plan for follow-up in 1 month or sooner if needed.      Relevant Orders   CBC   COMPLETE METABOLIC PANEL WITH GFR   HIV-1 RNA ultraquant reflex to gentyp+   T-helper cell (CD4)- ([facility] clinic only)   Hepatitis C antibody   Pneumococcal conjugate vaccine 13-valent IM (Completed)   Smoker    Continues to smoke tobacco on a daily basis.  Discussed importance of tobacco cessation to reduce risk of respiratory, malignant, or cardiovascular disease in the future.  He is in the precontemplation stage and not ready to quit at this time.  Continue to monitor.      Adjustment disorder with mixed disturbance of emotions and conduct    Question underlying depression/anxiety previously maintained on Lexapro and wishing to restart.  He has no suicidal ideations or symptoms of psychosis today.  Discussed medication may take up to 4 weeks to have effect.  Will restart Lexapro.  Recommend counseling and possible referral to psychiatry.      Healthcare maintenance     Prevnar updated today.  Discussed importance of safe sexual practice to reduce risk of  acquisition/transmission of STI.  Declines condoms.  Awaiting [facility] dental clinic for routine dental care.        Other Visit Diagnoses    Screening for STDs (sexually transmitted diseases)       Relevant Orders   RPR   Need for pneumococcal vaccine       Relevant Orders   Pneumococcal conjugate vaccine 13-valent IM (Completed)       I am having Jerry Finley start on escitalopram. I am also having him maintain his bictegravir-emtricitabine-tenofovir AF and Centrum Men.   Meds ordered this encounter  Medications  . escitalopram (LEXAPRO) 20 MG tablet    Sig: Take 1 tablet (20 mg total) by mouth daily.    Dispense:  30 tablet    Refill:  2    Order Specific Question:  Supervising Provider    Answer:   Judyann MunsonSNIDER, CYNTHIA [[id number]]     Follow-up: Return in about 1 month (around 04/17/2019), or if symptoms worsen or fail to improve.   Marcos EkeGreg Lachina Salsberry, MSN, FNP-C Nurse Practitioner [facility] Main number: [phone]

## 2019-03-17 NOTE — Patient Instructions (Signed)
Nice to see you.  We will get you re-started on medication once paperwork is approved.  Use the GoodRx card to get your Lexapro for now. Jerry Finley and [facility] appear to be the most cost effective options.   We will check your blood work today.  Plan for follow up in 1 month or sooner if needed.  Have a great day and stay safe!

## 2019-03-17 NOTE — Assessment & Plan Note (Signed)
Continues to smoke tobacco on a daily basis.  Discussed importance of tobacco cessation to reduce risk of respiratory, malignant, or cardiovascular disease in the future.  He is in the precontemplation stage and not ready to quit at this time.  Continue to monitor.

## 2019-03-17 NOTE — Assessment & Plan Note (Signed)
Question underlying depression/anxiety previously maintained on Lexapro and wishing to restart.  He has no suicidal ideations or symptoms of psychosis today.  Discussed medication may take up to 4 weeks to have effect.  Will restart Lexapro.  Recommend counseling and possible referral to psychiatry.

## 2019-03-17 NOTE — Assessment & Plan Note (Signed)
Jerry Finley has questionable status of his HIV with good tolerance of his Biktarvy.  He indicates he is only missed 1 day of medication as he had 2 other bottles, however prescriptions have not been filled in several months and he has not been seen since November of last year and his financial assistance ended in April.  Discussed importance of taking medication as prescribed.  Fortunately he has no signs/symptoms of opportunistic infection or progressive HIV disease at present.  Financial assistance renewed today.  Pharmacy working on obtaining additional assistance to obtain medication.  Restart Biktarvy.  Plan for follow-up in 1 month or sooner if needed.

## 2019-03-17 NOTE — Assessment & Plan Note (Signed)
   Prevnar updated today.  Discussed importance of safe sexual practice to reduce risk of acquisition/transmission of STI.  Declines condoms.  Awaiting [facility] dental clinic for routine dental care.

## 2019-03-17 NOTE — Telephone Encounter (Signed)
[facility] Patient Advocate Encounter  Met with patient today to get signatures on the [facility] Advancing Access application. He has already utilized the 30-day fill. ADAP is pending and he is reapplying for [place] Medicaid. Currently unemployed due to Covid and supported by family. Faxed completed application today and will notify the patient if he is approved for Advancing Access in the interim.  [phone] (preferred) [phone] (backup)

## 2019-03-18 LAB — T-HELPER CELL (CD4) - (RCID CLINIC ONLY)
CD4 % Helper T Cell: 43 % (ref 33–65)
CD4 T Cell Abs: 1282 /uL (ref 400–1790)

## 2019-03-20 NOTE — Telephone Encounter (Signed)
[employer] Advancing Access denied due to the pending status of ADAP. They would need a denial letter before the patient could be approved. Left voicemail to update the patient on his application status.ADAP application submitted on 03/17/2019.

## 2019-03-30 LAB — CBC
HCT: 43.3 % (ref 38.5–50.0)
Hemoglobin: 14.7 g/dL (ref 13.2–17.1)
MCH: 30.4 pg (ref 27.0–33.0)
MCHC: 33.9 g/dL (ref 32.0–36.0)
MCV: 89.5 fL (ref 80.0–100.0)
MPV: 9.1 fL (ref 7.5–12.5)
Platelets: 334 10*3/uL (ref 140–400)
RBC: 4.84 10*6/uL (ref 4.20–5.80)
RDW: 12.9 % (ref 11.0–15.0)
WBC: 7.5 10*3/uL (ref 3.8–10.8)

## 2019-03-30 LAB — COMPLETE METABOLIC PANEL WITH GFR
AG Ratio: 1.5 (calc) (ref 1.0–2.5)
ALT: 18 U/L (ref 9–46)
AST: 39 U/L (ref 10–40)
Albumin: 4.4 g/dL (ref 3.6–5.1)
Alkaline phosphatase (APISO): 125 U/L (ref 36–130)
BUN: 21 mg/dL (ref 7–25)
CO2: 22 mmol/L (ref 20–32)
Calcium: 9.5 mg/dL (ref 8.6–10.3)
Chloride: 106 mmol/L (ref 98–110)
Creat: 0.87 mg/dL (ref 0.60–1.35)
GFR, Est African American: 140 mL/min/{1.73_m2} (ref >=60)
GFR, Est Non African American: 121 mL/min/{1.73_m2} (ref >=60)
Globulin: 2.9 g/dL (calc) (ref 1.9–3.7)
Glucose, Bld: 97 mg/dL (ref 65–99)
Potassium: 4 mmol/L (ref 3.5–5.3)
Sodium: 138 mmol/L (ref 135–146)
Total Bilirubin: 0.5 mg/dL (ref 0.2–1.2)
Total Protein: 7.3 g/dL (ref 6.1–8.1)

## 2019-03-30 LAB — HIV-1 RNA ULTRAQUANT REFLEX TO GENTYP+
HIV 1 RNA Quant: <20 copies/mL
HIV-1 RNA Quant, Log: <1.3 Log copies/mL

## 2019-03-30 LAB — RPR: RPR Ser Ql: NONREACTIVE

## 2019-03-30 LAB — HEPATITIS C ANTIBODY
Hepatitis C Ab: NONREACTIVE
SIGNAL TO CUT-OFF: 0.04 (ref <1.00)

## 2019-03-30 NOTE — Progress Notes (Signed)
Left message on mobile phone to call the office.

## 2019-03-31 ENCOUNTER — Telehealth: Payer: Self-pay

## 2019-03-31 NOTE — Telephone Encounter (Signed)
Left patient HIPPA compliant voicemail to return call. Eugenia Mcalpine, LPN

## 2019-03-31 NOTE — Telephone Encounter (Signed)
-----   Message from [place], [place] sent at 03/30/2019 11:47 AM EDT ----- Please inform Jerry Finley that his viral load is undetectable and CD4 count is 1,282. Continue with current dose of Biktarvy and follow up in August as planned.

## 2019-04-16 ENCOUNTER — Ambulatory Visit: Payer: Medicaid Other | Admitting: Family

## 2019-04-22 ENCOUNTER — Ambulatory Visit: Payer: Medicaid Other | Admitting: Family

## 2019-06-26 ENCOUNTER — Other Ambulatory Visit: Payer: Self-pay

## 2019-06-26 ENCOUNTER — Emergency Department (HOSPITAL_COMMUNITY)
Admission: EM | Admit: 2019-06-26 | Discharge: 2019-06-26 | Disposition: A | Discharge status: Home / Self Care | Payer: Self-pay | Attending: Emergency Medicine | Admitting: Emergency Medicine

## 2019-06-26 ENCOUNTER — Emergency Department (HOSPITAL_COMMUNITY): Payer: Self-pay

## 2019-06-26 ENCOUNTER — Encounter (HOSPITAL_COMMUNITY): Payer: Self-pay | Admitting: Emergency Medicine

## 2019-06-26 DIAGNOSIS — F1210 Cannabis abuse, uncomplicated: Secondary | ICD-10-CM | POA: Insufficient documentation

## 2019-06-26 DIAGNOSIS — B20 Human immunodeficiency virus [HIV] disease: Secondary | ICD-10-CM | POA: Insufficient documentation

## 2019-06-26 DIAGNOSIS — F1410 Cocaine abuse, uncomplicated: Secondary | ICD-10-CM | POA: Insufficient documentation

## 2019-06-26 DIAGNOSIS — Z79899 Other long term (current) drug therapy: Secondary | ICD-10-CM | POA: Insufficient documentation

## 2019-06-26 DIAGNOSIS — F10920 Alcohol use, unspecified with intoxication, uncomplicated: Secondary | ICD-10-CM | POA: Insufficient documentation

## 2019-06-26 DIAGNOSIS — R0781 Pleurodynia: Secondary | ICD-10-CM | POA: Insufficient documentation

## 2019-06-26 DIAGNOSIS — F17210 Nicotine dependence, cigarettes, uncomplicated: Secondary | ICD-10-CM | POA: Insufficient documentation

## 2019-06-26 LAB — CBC WITH DIFFERENTIAL/PLATELET
Abs Immature Granulocytes: 0.04 10*3/uL (ref 0.00–0.07)
Basophils Absolute: 0 10*3/uL (ref 0.0–0.1)
Basophils Relative: 0 %
Eosinophils Absolute: 0.2 10*3/uL (ref 0.0–0.5)
Eosinophils Relative: 1 %
HCT: 35.3 % — ABNORMAL LOW (ref 39.0–52.0)
Hemoglobin: 11.5 g/dL — ABNORMAL LOW (ref 13.0–17.0)
Immature Granulocytes: 0 %
Lymphocytes Relative: 17 %
Lymphs Abs: 2.4 10*3/uL (ref 0.7–4.0)
MCH: 31.1 pg (ref 26.0–34.0)
MCHC: 32.6 g/dL (ref 30.0–36.0)
MCV: 95.4 fL (ref 80.0–100.0)
Monocytes Absolute: 1 10*3/uL (ref 0.1–1.0)
Monocytes Relative: 7 %
Neutro Abs: 10.4 10*3/uL — ABNORMAL HIGH (ref 1.7–7.7)
Neutrophils Relative %: 75 %
Platelets: 206 10*3/uL (ref 150–400)
RBC: 3.7 MIL/uL — ABNORMAL LOW (ref 4.22–5.81)
RDW: 12.8 % (ref 11.5–15.5)
WBC: 14.1 10*3/uL — ABNORMAL HIGH (ref 4.0–10.5)
nRBC: 0 % (ref 0.0–0.2)

## 2019-06-26 LAB — COMPREHENSIVE METABOLIC PANEL
ALT: 16 U/L (ref 0–44)
AST: 26 U/L (ref 15–41)
Albumin: 3.6 g/dL (ref 3.5–5.0)
Alkaline Phosphatase: 76 U/L (ref 38–126)
Anion gap: 12 (ref 5–15)
BUN: 16 mg/dL (ref 6–20)
CO2: 21 mmol/L — ABNORMAL LOW (ref 22–32)
Calcium: 8.3 mg/dL — ABNORMAL LOW (ref 8.9–10.3)
Chloride: 109 mmol/L (ref 98–111)
Creatinine, Ser: 0.97 mg/dL (ref 0.61–1.24)
GFR calc Af Amer: >60 mL/min (ref >60)
GFR calc non Af Amer: >60 mL/min (ref >60)
Glucose, Bld: 114 mg/dL — ABNORMAL HIGH (ref 70–99)
Potassium: 3.8 mmol/L (ref 3.5–5.1)
Sodium: 142 mmol/L (ref 135–145)
Total Bilirubin: 0.3 mg/dL (ref 0.3–1.2)
Total Protein: 6.2 g/dL — ABNORMAL LOW (ref 6.5–8.1)

## 2019-06-26 LAB — URINALYSIS, ROUTINE W REFLEX MICROSCOPIC
Bilirubin Urine: NEGATIVE
Glucose, UA: NEGATIVE mg/dL
Ketones, ur: NEGATIVE mg/dL
Leukocytes,Ua: NEGATIVE
Nitrite: NEGATIVE
Protein, ur: NEGATIVE mg/dL
Specific Gravity, Urine: 1.015 (ref 1.005–1.030)
pH: 5 (ref 5.0–8.0)

## 2019-06-26 LAB — RAPID URINE DRUG SCREEN, HOSP PERFORMED
Amphetamines: NOT DETECTED
Barbiturates: NOT DETECTED
Benzodiazepines: NOT DETECTED
Cocaine: NOT DETECTED
Opiates: NOT DETECTED
Tetrahydrocannabinol: NOT DETECTED

## 2019-06-26 LAB — SALICYLATE LEVEL: Salicylate Lvl: <7 mg/dL (ref 2.8–30.0)

## 2019-06-26 LAB — ACETAMINOPHEN LEVEL: Acetaminophen (Tylenol), Serum: <10 ug/mL — ABNORMAL LOW (ref 10–30)

## 2019-06-26 LAB — ETHANOL: Alcohol, Ethyl (B): 59 mg/dL — ABNORMAL HIGH (ref <10)

## 2019-06-26 MED ORDER — SODIUM CHLORIDE 0.9 % IV BOLUS
1000.0000 mL | Freq: Once | INTRAVENOUS | Status: AC
  Administered 2019-06-26: 1000 mL via INTRAVENOUS

## 2019-06-26 NOTE — ED Provider Notes (Signed)
[facility] Provider Note: Lowella DellJ. Lane Chipper Koudelka, MD, FACEP  CSN: [id number] MRN: [id number] ARRIVAL: 06/26/19 at 0354 ROOM: WA12/WA12   CHIEF COMPLAINT  Alcohol Intoxication, Hypotension, and Rib Injury  Level 5 caveat: Altered mental status HISTORY OF PRESENT ILLNESS  06/26/19 4:06 AM Rise Patienceicholas J Swicegood is a 23 y.o. male with a history of HIV on Biktarvy and polysubstance abuse.  He was brought by EMS after a family member called regarding the patient falling.  He admitted to drinking a bottle of wine earlier.  He also admitted to taking 200 mg of Benadryl which he states he takes every evening to sleep.  On EMS arrival he was sitting on the toilet pale and diaphoretic.  They initially had difficulty palpating a pulse and he was unable to stand without assistance.  He denies melena or hematochezia and EMS reports there was no blood or black stool in the toilet bowl when he was finished using the bathroom.  He was given 500 mL of normal saline IV with improvement in his blood pressure to 110 systolic.  He is complaining of pain in his left lower ribs due to the fall.  He continues to be pale.  He denies any illicit drug use.   Past Medical History:  Diagnosis Date  . Concussion with loss of consciousness 06/25/2016  . 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 HIV (human immunodeficiency virus infection) (HCC)   . Nausea with vomiting 06/25/2016  . Smoker 11/20/2016    Past Surgical History:  Procedure Laterality Date  . HAND SURGERY      Family History  Problem Relation Age of Onset  . Healthy Mother   . Healthy Father     Social History   Tobacco Use  . Smoking status: Current Every Day Smoker    Packs/day: 1.00    Types: Cigarettes  . Smokeless tobacco: Never Used  Substance Use Topics  . Alcohol use: Yes    Comment: socially  . Drug use: Not Currently    Types: Cocaine, Marijuana, Methamphetamines    Prior to Admission medications   Medication  Sig Start Date End Date Taking? Authorizing Provider  bictegravir-emtricitabine-tenofovir AF (BIKTARVY) 50-200-25 MG TABS tablet Take 1 tablet by mouth daily. 06/09/18  Yes Veryl Speakalone, Gregory D, FNP  ibuprofen (ADVIL) 200 MG tablet Take 800 mg by mouth every 6 (six) hours as needed (For pain.).   Yes [provider]  Multiple Vitamins-Minerals (CENTRUM MEN) TABS Take 1 tablet by mouth daily.   Yes [provider]    Allergies Patient has no known allergies.   REVIEW OF SYSTEMS     PHYSICAL EXAMINATION  Initial Vital Signs Blood pressure 106/64, pulse 81, temperature 97.9 F (36.6 C), temperature source Oral, resp. rate 13, SpO2 95 %.  Examination General: Well-developed, thin male in no acute distress; appearance consistent with age of record HENT: normocephalic; atraumatic Eyes: pupils equal, round and reactive to light; extraocular muscles grossly intact Neck: supple Heart: regular rate and rhythm Lungs: clear to auscultation bilaterally Chest: Left lower anterolateral rib tenderness without deformity or crepitus Abdomen: soft; nondistended; nontender; no masses or hepatosplenomegaly; bowel sounds present Extremities: No deformity; full range of motion; pulses weak Neurologic: Somnolent but arousable; motor function intact in all extremities and symmetric; no facial droop Skin: Pale; clammy Psychiatric: Flat affect   RESULTS  Summary of this visit's results, reviewed by myself:   EKG Interpretation  Date/Time:    Ventricular Rate:  PR Interval:    QRS Duration:   QT Interval:    QTC Calculation:   R Axis:     Text Interpretation:        Laboratory Studies: Results for orders placed or performed during the hospital encounter of 06/26/19 (from the past 24 hour(s))  Ethanol     Status: Abnormal   Collection Time: 06/26/19  4:38 AM  Result Value Ref Range   Alcohol, Ethyl (B) 59 (H) <10 mg/dL  Salicylate level     Status: None   Collection Time:  06/26/19  4:38 AM  Result Value Ref Range   Salicylate Lvl <7.0 2.8 - 30.0 mg/dL  Acetaminophen level     Status: Abnormal   Collection Time: 06/26/19  4:38 AM  Result Value Ref Range   Acetaminophen (Tylenol), Serum <10 (L) 10 - 30 ug/mL  CBC with Differential/Platelet     Status: Abnormal   Collection Time: 06/26/19  4:38 AM  Result Value Ref Range   WBC 14.1 (H) 4.0 - 10.5 K/uL   RBC 3.70 (L) 4.22 - 5.81 MIL/uL   Hemoglobin 11.5 (L) 13.0 - 17.0 g/dL   HCT 94.1 (L) 74.0 - 81.4 %   MCV 95.4 80.0 - 100.0 fL   MCH 31.1 26.0 - 34.0 pg   MCHC 32.6 30.0 - 36.0 g/dL   RDW 48.1 85.6 - 31.4 %   Platelets 206 150 - 400 K/uL   nRBC 0.0 0.0 - 0.2 %   Neutrophils Relative % 75 %   Neutro Abs 10.4 (H) 1.7 - 7.7 K/uL   Lymphocytes Relative 17 %   Lymphs Abs 2.4 0.7 - 4.0 K/uL   Monocytes Relative 7 %   Monocytes Absolute 1.0 0.1 - 1.0 K/uL   Eosinophils Relative 1 %   Eosinophils Absolute 0.2 0.0 - 0.5 K/uL   Basophils Relative 0 %   Basophils Absolute 0.0 0.0 - 0.1 K/uL   Immature Granulocytes 0 %   Abs Immature Granulocytes 0.04 0.00 - 0.07 K/uL  Comprehensive metabolic panel     Status: Abnormal   Collection Time: 06/26/19  4:38 AM  Result Value Ref Range   Sodium 142 135 - 145 mmol/L   Potassium 3.8 3.5 - 5.1 mmol/L   Chloride 109 98 - 111 mmol/L   CO2 21 (L) 22 - 32 mmol/L   Glucose, Bld 114 (H) 70 - 99 mg/dL   BUN 16 6 - 20 mg/dL   Creatinine, Ser 9.70 0.61 - 1.24 mg/dL   Calcium 8.3 (L) 8.9 - 10.3 mg/dL   Total Protein 6.2 (L) 6.5 - 8.1 g/dL   Albumin 3.6 3.5 - 5.0 g/dL   AST 26 15 - 41 U/L   ALT 16 0 - 44 U/L   Alkaline Phosphatase 76 38 - 126 U/L   Total Bilirubin 0.3 0.3 - 1.2 mg/dL   GFR calc non Af Amer >60 >60 mL/min   GFR calc Af Amer >60 >60 mL/min   Anion gap 12 5 - 15  Urinalysis, Routine w reflex microscopic     Status: Abnormal   Collection Time: 06/26/19  8:25 AM  Result Value Ref Range   Color, Urine YELLOW YELLOW   APPearance HAZY (A) CLEAR   Specific  Gravity, Urine 1.015 1.005 - 1.030   pH 5.0 5.0 - 8.0   Glucose, UA NEGATIVE NEGATIVE mg/dL   Hgb urine dipstick MODERATE (A) NEGATIVE   Bilirubin Urine NEGATIVE NEGATIVE   Ketones, ur NEGATIVE NEGATIVE mg/dL  Protein, ur NEGATIVE NEGATIVE mg/dL   Nitrite NEGATIVE NEGATIVE   Leukocytes,Ua NEGATIVE NEGATIVE   RBC / HPF 6-10 0 - 5 RBC/hpf   WBC, UA 0-5 0 - 5 WBC/hpf   Bacteria, UA RARE (A) NONE SEEN   Mucus PRESENT    Hyaline Casts, UA PRESENT   Rapid urine drug screen (hospital performed)     Status: None   Collection Time: 06/26/19  8:26 AM  Result Value Ref Range   Opiates NONE DETECTED NONE DETECTED   Cocaine NONE DETECTED NONE DETECTED   Benzodiazepines NONE DETECTED NONE DETECTED   Amphetamines NONE DETECTED NONE DETECTED   Tetrahydrocannabinol NONE DETECTED NONE DETECTED   Barbiturates NONE DETECTED NONE DETECTED   Imaging Studies: Dg Chest Port 1 View  Result Date: 06/26/2019 CLINICAL DATA:  Fall, left-sided pain, intoxication EXAM: PORTABLE CHEST 1 VIEW COMPARISON:  Radiograph 11/23/2018 FINDINGS: Posterolateral left C6 in seventh rib fractures with callus formation have a subacute to chronic appearance but are new since March of 2020. No other displaced rib fractures or acute osseous abnormality. No consolidation, features of edema, pneumothorax, or effusion. Pulmonary vascularity is normally distributed. The cardiomediastinal contours are unremarkable. IMPRESSION: Posterolateral left 6th and 7th rib fractures with callus formation favoring subacute to chronic timing but new since 11/23/2018. Could correlate for point tenderness. No other displaced rib fractures or acute osseous abnormality. No acute cardiopulmonary abnormality. Electronically Signed   By: Lovena Le M.D.   On: 06/26/2019 06:28    ED COURSE and MDM  Nursing notes and initial vitals signs, including pulse oximetry, reviewed.  Vitals:   06/26/19 0700 06/26/19 0730 06/26/19 0800 06/26/19 0954  BP: 113/62  110/65 106/60 108/73  Pulse: 88 84 83 82  Resp: (!) 26 (!) 23 14 17   Temp:      TempSrc:      SpO2: 96% 97% 96% 98%  Weight:      Height:        PROCEDURES    ED DIAGNOSES     ICD-10-CM   1. Rib pain on left side  R07.81   2. Chest pain, pleuritic  R07.81   3. Alcoholic intoxication without complication ([place])  V37.106        Shanon Rosser, MD 06/26/19 2239

## 2019-06-26 NOTE — ED Triage Notes (Signed)
Pt arrived via [facility], pt found diaphoretic with complaints of fall with left rib pain. ETOH on board, pt stated he also took 200mg  of Benadryl to try and sleep.   CBG 113  Temp 97.7 BP 110/66 Spo2 100% 2L HR 88 Resp 20

## 2019-06-26 NOTE — ED Notes (Addendum)
Pt stated he could not urinate and he has tried to give [place] a specimen twice

## 2019-06-26 NOTE — Discharge Instructions (Addendum)
As we discussed today, you should try to avoid using benadryl as a sleep aid. This medication can have long-term side effects if you use too much, including cardiac problems and hallucinations and mental problems. Never use more than the amount prescribed or directed on the package.  A good alternative would be melatonin - try starting at 3 mg tablets, and you can increase up to 10 mg tablets maximum for sleep aid.

## 2019-06-26 NOTE — ED Provider Notes (Signed)
Clinical Course as of Jun 26 1811  Fri Jun 26, 2019  0729 23 yo male with daily etoh consumption and benadryl usage, presenting with mechanical fall last night, complaining of left sided rib pain.  Takes 8 benadryl per night.  Imaging with subacute rib fx, no focal tenderness on exam to correlate with this.  WBC elevated but no other SIRS criteria and no infectious symptoms per report.  Plan to finish IVF, check UA, discharge     [MT]  0851 Rare bacteria only, no WBC, no leuks or nitrites, do not suspect this is cystitis   [MT]  0910 Uds negative.   [MT]  [phone] Patient is feeling better.  We discussed his benadryl usage (he reports he takes up to 200 mg at night, but not every night, for sleep).  Strongly advised that he stop this in favor of melatonin as a sleep aid.  Explained side effects of prolonged benadryl.  He verbalizes understanding.  Will discharge   [MT]    Clinical Course User Index [MT] Tika Hannis, Carola Rhine, MD    This note was dictated using dragon dictation software.  Please be aware that there may be minor translation errors as a result of this oral dictation    Wyvonnia Dusky, MD 06/26/19 [phone]

## 2019-07-03 ENCOUNTER — Other Ambulatory Visit: Payer: Self-pay | Admitting: Family

## 2019-07-03 DIAGNOSIS — B20 Human immunodeficiency virus [HIV] disease: Secondary | ICD-10-CM

## 2019-07-06 ENCOUNTER — Telehealth: Payer: Self-pay | Admitting: *Deleted

## 2019-07-06 NOTE — Telephone Encounter (Signed)
Received refill request for biktarvy. Patient was last seen 03/2019, asked to return in August but no-showed. ADAP up to date. RN refilled #[phone] + 1 refill, added note for patient to call and schedule appointment asap.  RN called patient to relay this information, but the call could not be completed.  RN called the mobile number listed, patient's father answered and gave an updated number for Jerry Finley - [phone].  RN left message asking [name] to call his doctor's office and schedule an appointment. Landis Gandy, RN

## 2019-08-10 ENCOUNTER — Encounter: Payer: Self-pay | Admitting: Family

## 2019-08-10 ENCOUNTER — Ambulatory Visit (INDEPENDENT_AMBULATORY_CARE_PROVIDER_SITE_OTHER): Payer: Self-pay | Admitting: Family

## 2019-08-10 ENCOUNTER — Other Ambulatory Visit: Payer: Self-pay

## 2019-08-10 VITALS — BP 152/78 | HR 112 | Temp 98.2°F | Wt 130.0 lb

## 2019-08-10 DIAGNOSIS — Z113 Encounter for screening for infections with a predominantly sexual mode of transmission: Secondary | ICD-10-CM

## 2019-08-10 DIAGNOSIS — F4325 Adjustment disorder with mixed disturbance of emotions and conduct: Secondary | ICD-10-CM

## 2019-08-10 DIAGNOSIS — F17200 Nicotine dependence, unspecified, uncomplicated: Secondary | ICD-10-CM

## 2019-08-10 DIAGNOSIS — B20 Human immunodeficiency virus [HIV] disease: Secondary | ICD-10-CM

## 2019-08-10 DIAGNOSIS — Z0000 Encounter for general adult medical examination without abnormal findings: Secondary | ICD-10-CM

## 2019-08-10 MED ORDER — BIKTARVY 50-200-25 MG PO TABS: 1.0000 | ORAL_TABLET | Freq: Every day | ORAL | 3 refills | Status: DC

## 2019-08-10 MED ORDER — ESCITALOPRAM OXALATE 10 MG PO TABS: 10.0000 mg | ORAL_TABLET | Freq: Every day | ORAL | 2 refills | Status: DC

## 2019-08-10 NOTE — Assessment & Plan Note (Signed)
Mr. Scholle has well-controlled HIV disease with good adherence and tolerance to his ART regimen of Biktarvy.  No signs/symptoms of opportunistic infection or progressive HIV disease.  Previous blood work reviewed with plan of care discussed.  Discussed importance of taking medication as prescribed and obtaining medication from the pharmacy in a timely manner.  Continue current dose of Biktarvy.  Check blood work today.  Plan for follow-up in 1 month or sooner if needed.

## 2019-08-10 NOTE — Addendum Note (Signed)
Addended by: Emmerie Battaglia D on: 08/10/2019 04:55 PM   Modules accepted: Orders  

## 2019-08-10 NOTE — Patient Instructions (Addendum)
Nice to see you.  We will check your blood work today.   Continue to take your Tilden as prescribed daily.   Schedule a time for a nurse visit for flu shot and Pneumovax.  We will plan to follow up in 1 month or sooner if needed with lab work at that visit.   Have a great day and stay safe.

## 2019-08-10 NOTE — Assessment & Plan Note (Signed)
   Declines vaccinations today.  Due for influenza and Pneumovax at next office visit or through nurse visit.  Discussed importance of safe sexual practice to reduce risk of STI.  Condoms provided.  Due for dental visit and will refer to [facility] clinic.

## 2019-08-10 NOTE — Addendum Note (Signed)
Addended by: Dolan Amen D on: 08/10/2019 04:55 PM   Modules accepted: Orders
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
## 2019-08-10 NOTE — Assessment & Plan Note (Signed)
Mr. Jerry Finley continues to smoke.  Discussed importance of tobacco cessation to reduce risk of malignant, cardiovascular, respiratory disease in the future.  He is in the precontemplation stage of quitting in has no plans at the current time.  Continue to monitor.

## 2019-08-10 NOTE — Assessment & Plan Note (Signed)
Jerry Finley has waxing and waning depression/anxiety due to multiple stressors.  Discussed the role of counseling and the benefit it would provide.  He would like to get restarted on his Lexapro that has worked in the past. Prescription for Lexapro 10 mg daily sent to pharmacy. No suicidal ideations or signs/symptoms of psychosis today. Plan to re-evaluate in one month or sooner if needed.

## 2019-08-11 LAB — T-HELPER CELL (CD4) - (RCID CLINIC ONLY)
CD4 % Helper T Cell: 39 % (ref 33–65)
CD4 T Cell Abs: 1328 /uL (ref 400–1790)

## 2019-08-13 ENCOUNTER — Ambulatory Visit: Payer: Medicaid Other

## 2019-08-18 LAB — COMPLETE METABOLIC PANEL WITH GFR
AG Ratio: 1.4 (calc) (ref 1.0–2.5)
ALT: 17 U/L (ref 9–46)
AST: 24 U/L (ref 10–40)
Albumin: 4.3 g/dL (ref 3.6–5.1)
Alkaline phosphatase (APISO): 107 U/L (ref 36–130)
BUN: 23 mg/dL (ref 7–25)
CO2: 26 mmol/L (ref 20–32)
Calcium: 9.5 mg/dL (ref 8.6–10.3)
Chloride: 102 mmol/L (ref 98–110)
Creat: 0.89 mg/dL (ref 0.60–1.35)
GFR, Est African American: 139 mL/min/{1.73_m2} (ref >=60)
GFR, Est Non African American: 120 mL/min/{1.73_m2} (ref >=60)
Globulin: 3 g/dL (calc) (ref 1.9–3.7)
Glucose, Bld: 106 mg/dL — ABNORMAL HIGH (ref 65–99)
Potassium: 3.9 mmol/L (ref 3.5–5.3)
Sodium: 138 mmol/L (ref 135–146)
Total Bilirubin: 0.5 mg/dL (ref 0.2–1.2)
Total Protein: 7.3 g/dL (ref 6.1–8.1)

## 2019-08-18 LAB — HIV-1 RNA QUANT-NO REFLEX-BLD
HIV 1 RNA Quant: <20 copies/mL
HIV-1 RNA Quant, Log: <1.3 Log copies/mL

## 2019-08-18 LAB — RPR: RPR Ser Ql: NONREACTIVE

## 2019-08-19 ENCOUNTER — Telehealth: Payer: Self-pay

## 2019-08-19 NOTE — Telephone Encounter (Signed)
-----   Message from [place], [place] sent at 08/18/2019 12:46 PM EST ----- Please inform Mr. Malson that his blood work shows that his viral load is undetectable and CD4 count is 1328. Continue to take Biktarvy as prescribed.

## 2019-08-19 NOTE — Telephone Encounter (Signed)
Attempted to call patient about result. No answer and no voicemail left due to answer service unidentifiable. Will try to call patient again later. Jerry Finley

## 2019-08-21 NOTE — Telephone Encounter (Signed)
Was able to connect with patient and present lab results.  Jerry Finley

## 2019-09-09 ENCOUNTER — Ambulatory Visit: Payer: Medicaid Other | Admitting: Family

## 2020-01-25 ENCOUNTER — Other Ambulatory Visit: Payer: Self-pay

## 2020-01-25 ENCOUNTER — Encounter: Payer: Self-pay | Admitting: Family

## 2020-01-25 ENCOUNTER — Ambulatory Visit (INDEPENDENT_AMBULATORY_CARE_PROVIDER_SITE_OTHER): Admitting: Family

## 2020-01-25 VITALS — BP 142/85 | HR 82 | Temp 98.7°F

## 2020-01-25 DIAGNOSIS — B20 Human immunodeficiency virus [HIV] disease: Secondary | ICD-10-CM

## 2020-01-25 DIAGNOSIS — Z0000 Encounter for general adult medical examination without abnormal findings: Secondary | ICD-10-CM

## 2020-01-25 MED ORDER — BIKTARVY 50-200-25 MG PO TABS: 1.0000 | ORAL_TABLET | Freq: Every day | ORAL | 3 refills | Status: DC

## 2020-01-25 NOTE — Assessment & Plan Note (Signed)
   Declines vaccinations today.  Discussed possibility of Covid vaccination  Discussed importance of safe sexual practice to reduce risk of STI.

## 2020-01-25 NOTE — Assessment & Plan Note (Signed)
Mr. Skow continues to have well-controlled HIV disease with good adherence and tolerance to his ART regimen of Biktarvy.  No signs/symptoms of opportunistic infection or progressive HIV disease.  Reviewed his previous lab work and discussed the plan of care.  Check blood work today.  Continue current dose of Biktarvy.  Plan for follow-up in 6 months or sooner if needed with lab work on the same day.

## 2020-01-25 NOTE — Patient Instructions (Signed)
Nice to see you.  We will check your lab work today.  Continue to take your [place] daily as prescribed.   Refills have been sent to the pharmacy.  Plan for follow up in 6 months or sooner if needed.   Renew ADAP after July 1.   Have a great day!
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
## 2020-01-26 LAB — T-HELPER CELL (CD4) - (RCID CLINIC ONLY)
CD4 % Helper T Cell: 43 % (ref 33–65)
CD4 T Cell Abs: 811 /uL (ref 400–1790)

## 2020-01-27 LAB — COMPLETE METABOLIC PANEL WITH GFR
AG Ratio: 1.8 (calc) (ref 1.0–2.5)
ALT: 13 U/L (ref 9–46)
AST: 20 U/L (ref 10–40)
Albumin: 4.7 g/dL (ref 3.6–5.1)
Alkaline phosphatase (APISO): 107 U/L (ref 36–130)
BUN: 11 mg/dL (ref 7–25)
CO2: 25 mmol/L (ref 20–32)
Calcium: 9.5 mg/dL (ref 8.6–10.3)
Chloride: 104 mmol/L (ref 98–110)
Creat: 0.77 mg/dL (ref 0.60–1.35)
GFR, Est African American: 146 mL/min/{1.73_m2} (ref >=60)
GFR, Est Non African American: 126 mL/min/{1.73_m2} (ref >=60)
Globulin: 2.6 g/dL (calc) (ref 1.9–3.7)
Glucose, Bld: 75 mg/dL (ref 65–99)
Potassium: 4.4 mmol/L (ref 3.5–5.3)
Sodium: 141 mmol/L (ref 135–146)
Total Bilirubin: 0.5 mg/dL (ref 0.2–1.2)
Total Protein: 7.3 g/dL (ref 6.1–8.1)

## 2020-01-27 LAB — HIV-1 RNA QUANT-NO REFLEX-BLD
HIV 1 RNA Quant: <20 copies/mL
HIV-1 RNA Quant, Log: <1.3 Log copies/mL
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
## 2020-04-12 IMAGING — DX LEFT CLAVICLE - 2+ VIEWS
2 series · 2 of 2 positions shown · non-contrast
Comparison: None.

CLINICAL DATA: Acute LEFT clavicle pain following injury. Initial
encounter.

EXAM:
LEFT CLAVICLE - 2+ VIEWS

[clavicle ap]
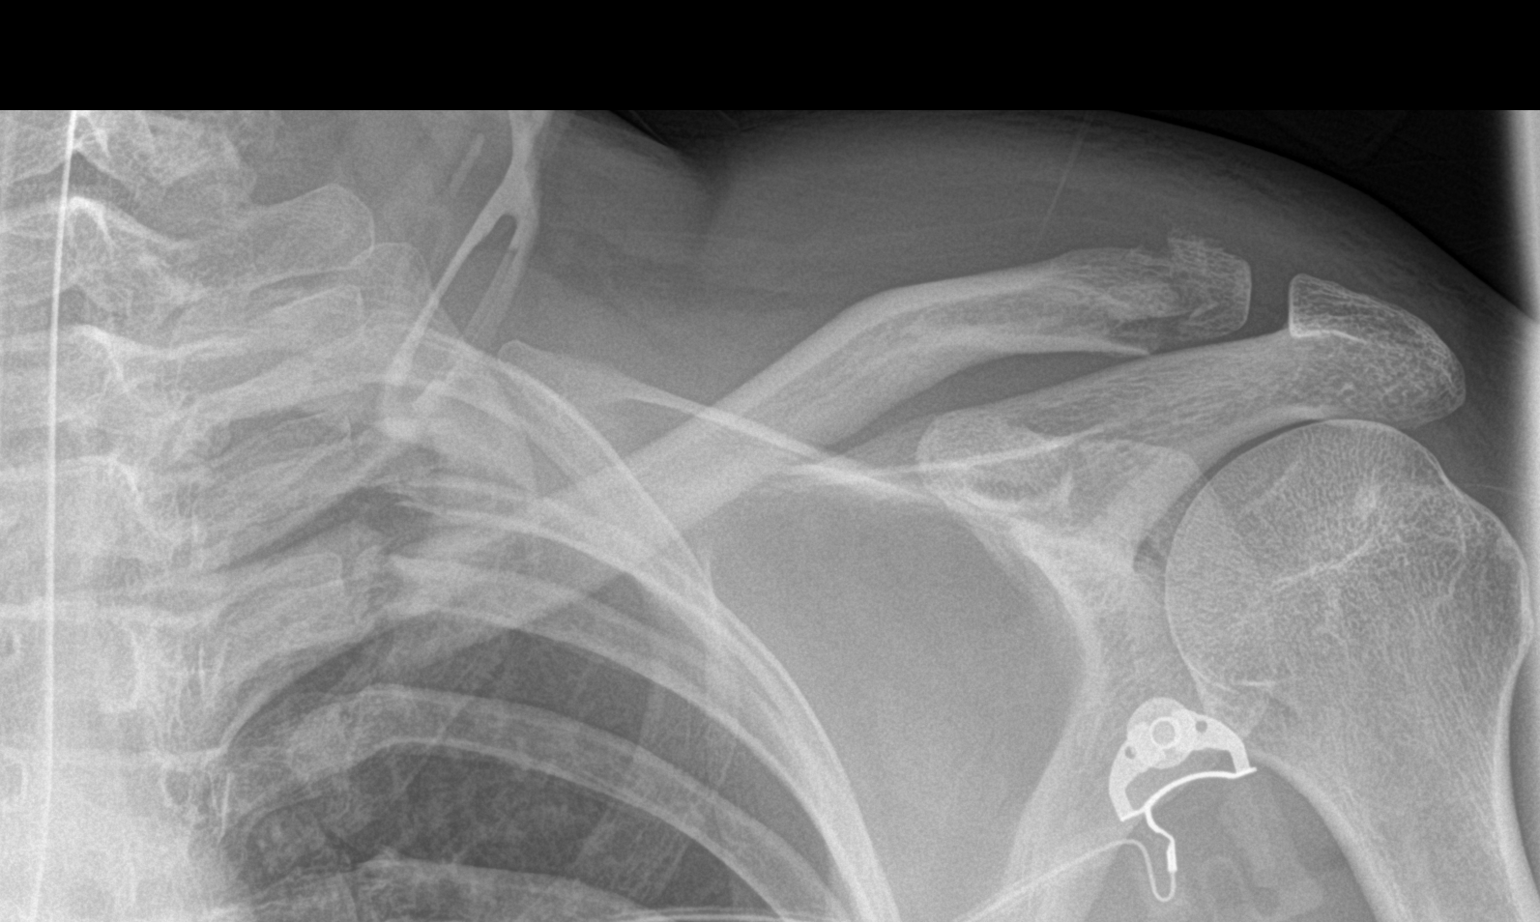

[clavicle axial]
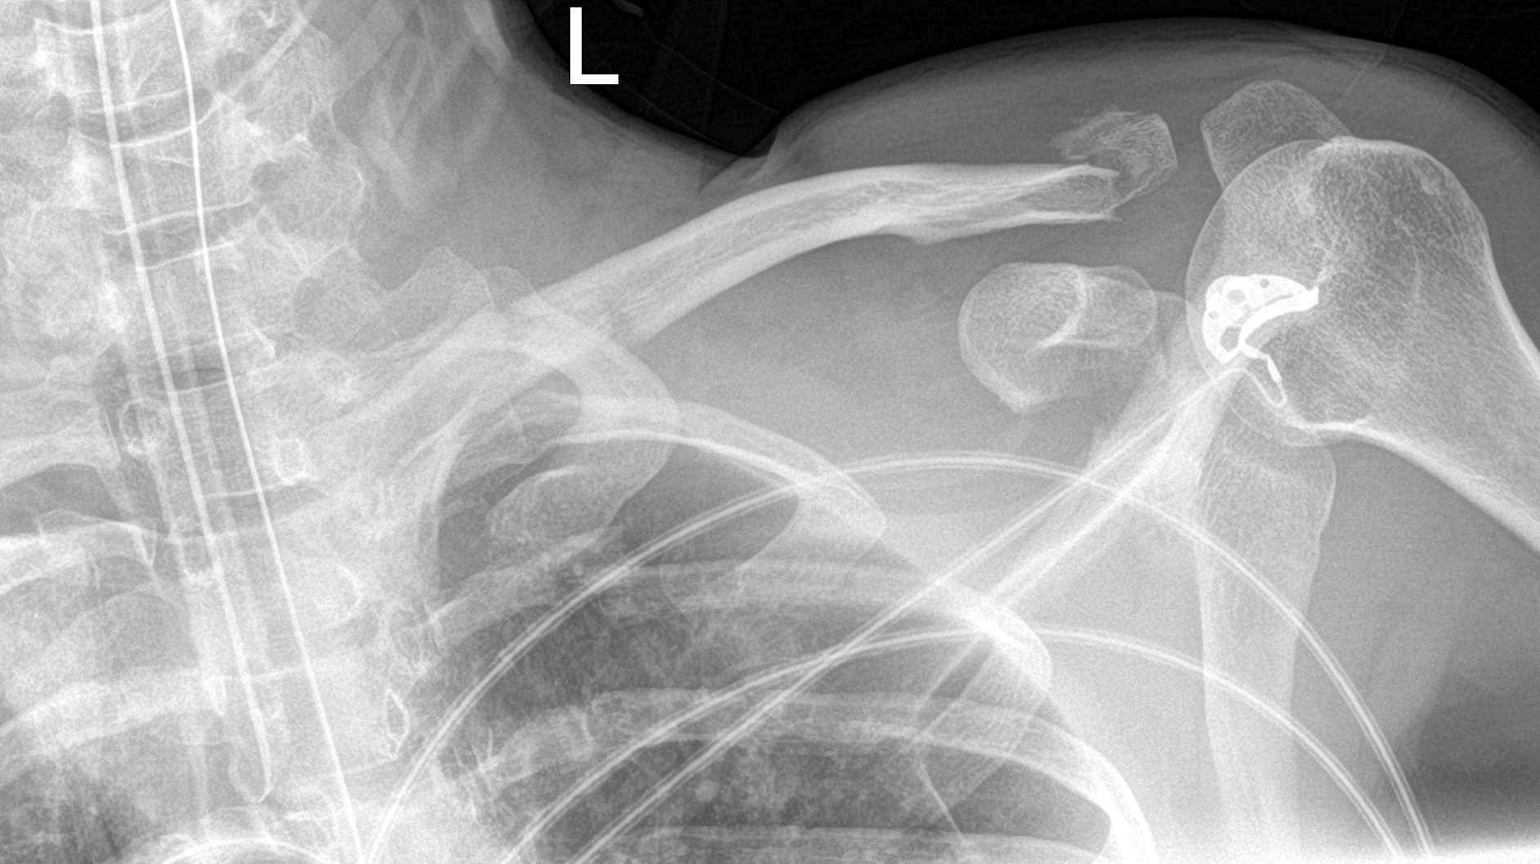

[2 of 2 positions shown; findings below may reference images not displayed]

FINDINGS: A vertical fracture of the proximal clavicle is noted with 5 mm
INFERIOR displacement.

A minimally comminuted fracture of the distal clavicle is noted with
3 mm SUPERIOR displacement.

A mildly displaced fracture of the scapula extending into the
glenohumeral joint is noted.

A fracture of the posteromedial LEFT 3rd rib is present.

The AC joint appears unremarkable.
IMPRESSION: Proximal and distal LEFT clavicle fractures as described.

UPPER scapular fracture extending to the glenohumeral joint.
Consider CT for further characterization.

LEFT 3rd rib fracture.

## 2020-11-28 ENCOUNTER — Telehealth: Payer: Self-pay | Admitting: Infectious Disease

## 2020-11-28 NOTE — Telephone Encounter (Signed)
Thanks Dorei!

## 2020-11-28 NOTE — Telephone Encounter (Signed)
Jerry Finley is currently incarcerated Dr. Lawanda Cousins from [facility] [place] [facility] in [place] prison system would like to obtain records from [place] including my last note and his vaccination history.  Her fax number is [phone]

## 2020-11-28 NOTE — Telephone Encounter (Signed)
Last office visit + immunization records faxed to provider. Notified her to keep an eye out. Will follow up as needed.   Ubah Radke Loyola Mast, RN

## 2022-04-16 ENCOUNTER — Ambulatory Visit: Payer: Self-pay | Admitting: Family

## 2022-04-16 ENCOUNTER — Ambulatory Visit: Payer: Self-pay

## 2022-04-16 ENCOUNTER — Telehealth: Payer: Self-pay

## 2022-04-16 NOTE — Telephone Encounter (Signed)
Called patient to reschedule missed appointment. No answer; received message "call could not be completed as dialed". Unable to leave voicemail.   Wyvonne Lenz, RN

## 2022-04-20 ENCOUNTER — Ambulatory Visit: Payer: Self-pay | Admitting: Family

## 2022-04-20 ENCOUNTER — Ambulatory Visit: Payer: Self-pay

## 2022-06-16 ENCOUNTER — Ambulatory Visit (HOSPITAL_COMMUNITY)
Admission: EM | Admit: 2022-06-16 | Discharge: 2022-06-16 | Disposition: A | Discharge status: Home / Self Care | Payer: Self-pay | Attending: Internal Medicine | Admitting: Internal Medicine

## 2022-06-16 ENCOUNTER — Ambulatory Visit (INDEPENDENT_AMBULATORY_CARE_PROVIDER_SITE_OTHER): Payer: Self-pay

## 2022-06-16 ENCOUNTER — Encounter (HOSPITAL_COMMUNITY): Payer: Self-pay

## 2022-06-16 DIAGNOSIS — M79671 Pain in right foot: Secondary | ICD-10-CM

## 2022-06-16 DIAGNOSIS — S93401A Sprain of unspecified ligament of right ankle, initial encounter: Secondary | ICD-10-CM

## 2022-06-16 DIAGNOSIS — M25571 Pain in right ankle and joints of right foot: Secondary | ICD-10-CM

## 2022-06-16 DIAGNOSIS — W19XXXA Unspecified fall, initial encounter: Secondary | ICD-10-CM
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
## 2022-06-16 NOTE — Discharge Instructions (Addendum)
The x-rays today do not show any broken bones in your foot or ankle.  We have put you in a cam boot-this is a walking boot.  You can wear this for 1 to 2 weeks, however with no improvement in the pain, please follow-up with a podiatrist.  Contact information is below.  In the meantime, please try to rest your foot/ankle, keep it elevated, use an Ace wrap when resting or sleeping, and use ice to help with pain and swelling.  You can also take Tylenol 500 to 1000 mg every 6 hours as needed for pain.

## 2022-06-16 NOTE — ED Triage Notes (Addendum)
Patient with c/o right foot pain/injury last night. Swelling and bruising noted to right foot.

## 2022-08-24 ENCOUNTER — Ambulatory Visit: Payer: Medicaid Other

## 2022-08-24 ENCOUNTER — Other Ambulatory Visit: Payer: Self-pay

## 2022-08-24 ENCOUNTER — Telehealth: Payer: Self-pay | Admitting: Pharmacist

## 2022-08-24 ENCOUNTER — Ambulatory Visit (INDEPENDENT_AMBULATORY_CARE_PROVIDER_SITE_OTHER): Payer: Medicaid Other | Admitting: Physician Assistant

## 2022-08-24 ENCOUNTER — Other Ambulatory Visit (HOSPITAL_COMMUNITY): Payer: Self-pay

## 2022-08-24 ENCOUNTER — Encounter: Payer: Self-pay | Admitting: Physician Assistant

## 2022-08-24 VITALS — BP 149/80 | HR 82 | Temp 98.1°F | Resp 16 | Wt 148.0 lb

## 2022-08-24 DIAGNOSIS — B20 Human immunodeficiency virus [HIV] disease: Secondary | ICD-10-CM

## 2022-08-24 DIAGNOSIS — Z23 Encounter for immunization: Secondary | ICD-10-CM

## 2022-08-24 DIAGNOSIS — F4329 Adjustment disorder with other symptoms: Secondary | ICD-10-CM

## 2022-08-24 MED ORDER — CABOTEGRAVIR & RILPIVIRINE ER 600 & 900 MG/3ML IM SUER
1.0000 | INTRAMUSCULAR | 1 refills | Status: DC
  Filled 2022-08-24 (×3): qty 6, 30d supply, fill #0
  Filled 2022-09-14: qty 6, 30d supply, fill #1

## 2022-08-24 MED ORDER — CABOTEGRAVIR & RILPIVIRINE ER 600 & 900 MG/3ML IM SUER
1.0000 | INTRAMUSCULAR | 5 refills | Status: DC
  Filled 2022-08-24: qty 6, 60d supply, fill #0
  Filled 2022-11-08: qty 6, 34d supply, fill #0

## 2022-08-24 MED ORDER — BIKTARVY 50-200-25 MG PO TABS: 1.0000 | ORAL_TABLET | Freq: Every day | ORAL | 1 refills | Status: DC

## 2022-08-24 NOTE — Telephone Encounter (Signed)
Patient returning to care with [facility] after being incarcerated for 3 years. He previously saw Dr. Daiva Eves and Tammy Sours here. He has only been on [place] for his HIV and has remained undetectable for several years. He ran out of medication 2 weeks ago after being released from prison, so I expect his viral load to jump up a little today. Discussed that we are happy to see him back in the next few weeks to start Cabenuva even if he has a small blip in his HIV RNA. He is covered 100% through Medicaid. Script [facility] to [facility]. Will meet with front office staff to schedule his first [place] appointment and follow-up with [facility].  Counseled that [place] is two separate intramuscular injections in the gluteal muscle on each side for each visit. Explained that the second injection is 30 days after the initial injection then every 2 months thereafter. Discussed the need for viral load monitoring every 2 months for the first 6 months and then periodically afterwards as their provider sees the need. Discussed the rare but significant chance of developing resistance despite compliance. Explained that showing up to injection appointments is very important and warned that if 2 appointments are missed, it will be reassessed by their provider whether they are a good candidate for injection therapy. Counseled on possible side effects associated with the injections such as injection site pain, which is usually mild to moderate in nature, injection site nodules, and injection site reactions. Asked to call the clinic or send me a mychart message if they experience any issues, such as fatigue, nausea, headache, rash, or dizziness. Advised that they can take ibuprofen or tylenol for injection site pain if needed.   Margarite Gouge, PharmD, CPP, BCIDP, AAHIVP Clinical Pharmacist Practitioner Infectious Diseases Clinical Pharmacist [facility]
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
## 2022-08-24 NOTE — Patient Instructions (Addendum)
Refill biktarvy, samples x 1 week provided; otherwise sent to pharmacy Discussed Cabanuva for after the new year to initiate with clinical pharmacist Margarite Gouge Labs today Follow up in 3 months Flu vaccine today, covid up to date Family services referral for counseling sent today they will call you

## 2022-08-27 ENCOUNTER — Telehealth: Payer: Self-pay

## 2022-08-27 NOTE — Telephone Encounter (Signed)
[facility] Patient Advocate Encounter  Patient's medication (Cabenuva) have been couriered to [facility] from [facility] and will be administered on the patient next office visit.     Aayushi Solorzano , CPhT [facility] Patient Advocate [facility] Phone: [phone] Fax:  [phone]  

## 2022-08-28 ENCOUNTER — Other Ambulatory Visit: Payer: Self-pay | Admitting: Pharmacist

## 2022-08-28 ENCOUNTER — Telehealth: Payer: Self-pay | Admitting: Physician Assistant

## 2022-08-28 DIAGNOSIS — L02413 Cutaneous abscess of right upper limb: Secondary | ICD-10-CM | POA: Diagnosis not present

## 2022-08-28 DIAGNOSIS — B20 Human immunodeficiency virus [HIV] disease: Secondary | ICD-10-CM

## 2022-08-28 DIAGNOSIS — S51812A Laceration without foreign body of left forearm, initial encounter: Secondary | ICD-10-CM | POA: Diagnosis not present

## 2022-08-28 DIAGNOSIS — A499 Bacterial infection, unspecified: Secondary | ICD-10-CM | POA: Diagnosis not present

## 2022-08-28 DIAGNOSIS — S71102A Unspecified open wound, left thigh, initial encounter: Secondary | ICD-10-CM | POA: Diagnosis not present

## 2022-08-28 MED ORDER — BICTEGRAVIR-EMTRICITAB-TENOFOV 50-200-25 MG PO TABS: 1.0000 | ORAL_TABLET | Freq: Every day | ORAL | 0 refills | Status: AC

## 2022-08-28 NOTE — Progress Notes (Signed)
Medication Samples have been provided to the patient.  Drug name: Biktarvy        Strength: 50/200/25 mg       Qty: 7 tablets (1 bottles) LOT: CPBDLA   Exp.Date: 3/26  Dosing instructions: Take one tablet by mouth once daily  The patient has been instructed regarding the correct time, dose, and frequency of taking this medication, including desired effects and most common side effects.   Davia Smyre, PharmD, CPP, BCIDP, AAHIVP Clinical Pharmacist Practitioner Infectious Diseases Clinical Pharmacist [facility]  

## 2022-08-28 NOTE — Telephone Encounter (Signed)
Called father however there was no answer

## 2022-08-28 NOTE — Telephone Encounter (Signed)
Attempted to call and review labs, will try to contact father

## 2022-08-30 ENCOUNTER — Telehealth: Payer: Self-pay

## 2022-08-30 NOTE — Telephone Encounter (Signed)
Attempted to connect with patient by calling Dad's phone number listed as requested by Mr. Hellberg. Unable to leave a voice message at time.  Valarie Cones, LPN

## 2022-08-30 NOTE — Telephone Encounter (Signed)
-----   Message from Horton Finer, [place] sent at 08/30/2022  9:18 AM EST ----- Please notify Jerry Finley his viral load is suppressed and immune system is strong.  He said to call his dad since he did not have a phone. I was unable to leave a message.

## 2022-08-31 NOTE — Telephone Encounter (Signed)
Called patient's father and requested to speak with Janyth Pupa. He provided patient's new phone number. [employer] on new number, no answer and no voicemail box set up.   Updated patient's contact information in chart.   Sandie Ano, RN

## 2022-09-04 LAB — COMPLETE METABOLIC PANEL WITH GFR
AG Ratio: 1.4 (calc) (ref 1.0–2.5)
ALT: 13 U/L (ref 9–46)
AST: 22 U/L (ref 10–40)
Albumin: 4.2 g/dL (ref 3.6–5.1)
Alkaline phosphatase (APISO): 106 U/L (ref 36–130)
BUN: 11 mg/dL (ref 7–25)
CO2: 26 mmol/L (ref 20–32)
Calcium: 9.7 mg/dL (ref 8.6–10.3)
Chloride: 103 mmol/L (ref 98–110)
Creat: 0.79 mg/dL (ref 0.60–1.24)
Globulin: 3.1 g/dL (calc) (ref 1.9–3.7)
Glucose, Bld: 111 mg/dL — ABNORMAL HIGH (ref 65–99)
Potassium: 4.2 mmol/L (ref 3.5–5.3)
Sodium: 138 mmol/L (ref 135–146)
Total Bilirubin: 0.5 mg/dL (ref 0.2–1.2)
Total Protein: 7.3 g/dL (ref 6.1–8.1)
eGFR: 125 mL/min/{1.73_m2} (ref >=60)

## 2022-09-04 LAB — CBC WITH DIFFERENTIAL/PLATELET
Absolute Monocytes: 689 cells/uL (ref 200–950)
Basophils Absolute: 20 cells/uL (ref 0–200)
Basophils Relative: 0.3 %
Eosinophils Absolute: 98 cells/uL (ref 15–500)
Eosinophils Relative: 1.5 %
HCT: 38.2 % — ABNORMAL LOW (ref 38.5–50.0)
Hemoglobin: 13.4 g/dL (ref 13.2–17.1)
Lymphs Abs: 2509 cells/uL (ref 850–3900)
MCH: 31.8 pg (ref 27.0–33.0)
MCHC: 35.1 g/dL (ref 32.0–36.0)
MCV: 90.5 fL (ref 80.0–100.0)
MPV: 8.5 fL (ref 7.5–12.5)
Monocytes Relative: 10.6 %
Neutro Abs: 3185 cells/uL (ref 1500–7800)
Neutrophils Relative %: 49 %
Platelets: 414 10*3/uL — ABNORMAL HIGH (ref 140–400)
RBC: 4.22 10*6/uL (ref 4.20–5.80)
RDW: 12.4 % (ref 11.0–15.0)
Total Lymphocyte: 38.6 %
WBC: 6.5 10*3/uL (ref 3.8–10.8)

## 2022-09-04 LAB — HIV-1 INTEGRASE GENOTYPE

## 2022-09-04 LAB — HIV-1 RNA QUANT-NO REFLEX-BLD
HIV 1 RNA Quant: NOT DETECTED Copies/mL
HIV-1 RNA Quant, Log: NOT DETECTED Log cps/mL

## 2022-09-04 LAB — LIPID PANEL
Cholesterol: 176 mg/dL (ref <200)
HDL: 58 mg/dL (ref >=40)
LDL Cholesterol (Calc): 99 mg/dL (calc)
Non-HDL Cholesterol (Calc): 118 mg/dL (calc) (ref <130)
Total CHOL/HDL Ratio: 3 (calc) (ref <5.0)
Triglycerides: 94 mg/dL (ref <150)

## 2022-09-04 LAB — T-HELPER CELLS (CD4) COUNT (NOT AT ARMC)
Absolute CD4: 850 cells/uL (ref 490–1740)
CD4 T Helper %: 33 % (ref 30–61)
Total lymphocyte count: 2589 cells/uL (ref 850–3900)

## 2022-09-11 NOTE — Telephone Encounter (Signed)
Was able to speak with patient today over his new phone number; discussed that his voicemail box has not been set up. He inquired about lab results which we reviewed. Will start [place] next week.  Alfonse Spruce, PharmD, CPP, BCIDP, [place] Clinical Pharmacist Practitioner Infectious [place] for Infectious Disease

## 2022-09-14 ENCOUNTER — Other Ambulatory Visit (HOSPITAL_COMMUNITY): Payer: Self-pay

## 2022-09-14 ENCOUNTER — Other Ambulatory Visit: Payer: Self-pay

## 2022-09-18 ENCOUNTER — Telehealth: Payer: Self-pay

## 2022-09-18 NOTE — Telephone Encounter (Signed)
[facility] Patient Advocate Encounter  Patient's medication Kern Reap) have been couriered to [facility] from [facility] and will be administered on the patient next office visit on 09/19/22.  Ileene Patrick , [place] [facility] Patient [facility] for Infectious Disease Phone: [phone] Fax:  [phone]

## 2022-09-19 ENCOUNTER — Ambulatory Visit: Payer: Medicaid Other | Admitting: Pharmacist

## 2022-09-25 ENCOUNTER — Inpatient Hospital Stay (HOSPITAL_COMMUNITY): Payer: Medicaid Other | Admitting: Certified Registered Nurse Anesthetist

## 2022-09-25 ENCOUNTER — Encounter (HOSPITAL_COMMUNITY): Admission: EM | Disposition: A | Discharge status: Home / Self Care | Payer: Self-pay | Source: Home / Self Care

## 2022-09-25 ENCOUNTER — Inpatient Hospital Stay (HOSPITAL_COMMUNITY): Payer: Medicaid Other

## 2022-09-25 ENCOUNTER — Emergency Department (HOSPITAL_COMMUNITY): Payer: Medicaid Other

## 2022-09-25 ENCOUNTER — Inpatient Hospital Stay (HOSPITAL_COMMUNITY)
Admission: EM | Admit: 2022-09-25 | Discharge: 2022-09-26 | DRG: 040 | Disposition: A | Discharge status: Home / Self Care | Payer: Medicaid Other | Attending: Surgery | Admitting: Surgery

## 2022-09-25 ENCOUNTER — Other Ambulatory Visit: Payer: Self-pay

## 2022-09-25 DIAGNOSIS — S41112A Laceration without foreign body of left upper arm, initial encounter: Principal | ICD-10-CM

## 2022-09-25 DIAGNOSIS — T148XXA Other injury of unspecified body region, initial encounter: Secondary | ICD-10-CM | POA: Diagnosis present

## 2022-09-25 DIAGNOSIS — S21212A Laceration without foreign body of left back wall of thorax without penetration into thoracic cavity, initial encounter: Secondary | ICD-10-CM

## 2022-09-25 DIAGNOSIS — J9601 Acute respiratory failure with hypoxia: Secondary | ICD-10-CM

## 2022-09-25 DIAGNOSIS — Z79899 Other long term (current) drug therapy: Secondary | ICD-10-CM | POA: Diagnosis not present

## 2022-09-25 DIAGNOSIS — Z23 Encounter for immunization: Secondary | ICD-10-CM

## 2022-09-25 DIAGNOSIS — R58 Hemorrhage, not elsewhere classified: Secondary | ICD-10-CM | POA: Diagnosis not present

## 2022-09-25 DIAGNOSIS — S5012XA Contusion of left forearm, initial encounter: Secondary | ICD-10-CM | POA: Diagnosis not present

## 2022-09-25 DIAGNOSIS — Y92009 Unspecified place in unspecified non-institutional (private) residence as the place of occurrence of the external cause: Secondary | ICD-10-CM

## 2022-09-25 DIAGNOSIS — Z4682 Encounter for fitting and adjustment of non-vascular catheter: Secondary | ICD-10-CM | POA: Diagnosis not present

## 2022-09-25 DIAGNOSIS — S56822A Laceration of other muscles, fascia and tendons at forearm level, left arm, initial encounter: Secondary | ICD-10-CM | POA: Diagnosis present

## 2022-09-25 DIAGNOSIS — S55112A Laceration of radial artery at forearm level, left arm, initial encounter: Secondary | ICD-10-CM | POA: Diagnosis present

## 2022-09-25 DIAGNOSIS — R404 Transient alteration of awareness: Secondary | ICD-10-CM | POA: Diagnosis not present

## 2022-09-25 DIAGNOSIS — S55192A Other specified injury of radial artery at forearm level, left arm, initial encounter: Secondary | ICD-10-CM | POA: Diagnosis not present

## 2022-09-25 DIAGNOSIS — S65112A Laceration of radial artery at wrist and hand level of left arm, initial encounter: Secondary | ICD-10-CM

## 2022-09-25 DIAGNOSIS — S51812A Laceration without foreign body of left forearm, initial encounter: Secondary | ICD-10-CM | POA: Diagnosis present

## 2022-09-25 DIAGNOSIS — S61502A Unspecified open wound of left wrist, initial encounter: Secondary | ICD-10-CM | POA: Diagnosis not present

## 2022-09-25 DIAGNOSIS — F1010 Alcohol abuse, uncomplicated: Secondary | ICD-10-CM | POA: Diagnosis present

## 2022-09-25 DIAGNOSIS — I959 Hypotension, unspecified: Secondary | ICD-10-CM | POA: Diagnosis present

## 2022-09-25 DIAGNOSIS — T794XXA Traumatic shock, initial encounter: Secondary | ICD-10-CM | POA: Diagnosis present

## 2022-09-25 DIAGNOSIS — I10 Essential (primary) hypertension: Secondary | ICD-10-CM | POA: Diagnosis not present

## 2022-09-25 DIAGNOSIS — R55 Syncope and collapse: Secondary | ICD-10-CM | POA: Diagnosis not present

## 2022-09-25 DIAGNOSIS — D649 Anemia, unspecified: Secondary | ICD-10-CM

## 2022-09-25 DIAGNOSIS — F17210 Nicotine dependence, cigarettes, uncomplicated: Secondary | ICD-10-CM | POA: Diagnosis not present

## 2022-09-25 DIAGNOSIS — S2190XA Unspecified open wound of unspecified part of thorax, initial encounter: Secondary | ICD-10-CM | POA: Diagnosis not present

## 2022-09-25 DIAGNOSIS — Z21 Asymptomatic human immunodeficiency virus [HIV] infection status: Secondary | ICD-10-CM | POA: Diagnosis present

## 2022-09-25 DIAGNOSIS — T1490XA Injury, unspecified, initial encounter: Secondary | ICD-10-CM | POA: Diagnosis not present

## 2022-09-25 DIAGNOSIS — S21219A Laceration without foreign body of unspecified back wall of thorax without penetration into thoracic cavity, initial encounter: Secondary | ICD-10-CM | POA: Diagnosis not present

## 2022-09-25 DIAGNOSIS — S5422XA Injury of radial nerve at forearm level, left arm, initial encounter: Principal | ICD-10-CM | POA: Diagnosis present

## 2022-09-25 DIAGNOSIS — R0902 Hypoxemia: Secondary | ICD-10-CM | POA: Diagnosis not present

## 2022-09-25 DIAGNOSIS — S20402A Unspecified superficial injuries of left back wall of thorax, initial encounter: Secondary | ICD-10-CM | POA: Diagnosis not present

## 2022-09-25 HISTORY — PX: ARTERY REPAIR: SHX559

## 2022-09-25 LAB — COMPREHENSIVE METABOLIC PANEL
ALT: 17 U/L (ref 0–44)
AST: 26 U/L (ref 15–41)
Albumin: 2.9 g/dL — ABNORMAL LOW (ref 3.5–5.0)
Alkaline Phosphatase: 59 U/L (ref 38–126)
Anion gap: 11 (ref 5–15)
BUN: 6 mg/dL (ref 6–20)
CO2: 19 mmol/L — ABNORMAL LOW (ref 22–32)
Calcium: 7.3 mg/dL — ABNORMAL LOW (ref 8.9–10.3)
Chloride: 111 mmol/L (ref 98–111)
Creatinine, Ser: 0.91 mg/dL (ref 0.61–1.24)
GFR, Estimated: >60 mL/min (ref >60)
Glucose, Bld: 144 mg/dL — ABNORMAL HIGH (ref 70–99)
Potassium: 4.5 mmol/L (ref 3.5–5.1)
Sodium: 141 mmol/L (ref 135–145)
Total Bilirubin: 0.5 mg/dL (ref 0.3–1.2)
Total Protein: 5.1 g/dL — ABNORMAL LOW (ref 6.5–8.1)

## 2022-09-25 LAB — GLUCOSE, CAPILLARY
Glucose-Capillary: 97 mg/dL (ref 70–99)
Glucose-Capillary: 101 mg/dL — ABNORMAL HIGH (ref 70–99)

## 2022-09-25 LAB — CBC
HCT: 33 % — ABNORMAL LOW (ref 39.0–52.0)
Hemoglobin: 10.8 g/dL — ABNORMAL LOW (ref 13.0–17.0)
MCH: 30.8 pg (ref 26.0–34.0)
MCHC: 32.7 g/dL (ref 30.0–36.0)
MCV: 94 fL (ref 80.0–100.0)
Platelets: 211 10*3/uL (ref 150–400)
RBC: 3.51 MIL/uL — ABNORMAL LOW (ref 4.22–5.81)
RDW: 12.3 % (ref 11.5–15.5)
WBC: 6.8 10*3/uL (ref 4.0–10.5)
nRBC: 0 % (ref 0.0–0.2)

## 2022-09-25 LAB — URINALYSIS, ROUTINE W REFLEX MICROSCOPIC
Bilirubin Urine: NEGATIVE
Glucose, UA: NEGATIVE mg/dL
Ketones, ur: NEGATIVE mg/dL
Leukocytes,Ua: NEGATIVE
Nitrite: NEGATIVE
Protein, ur: 100 mg/dL — AB
Specific Gravity, Urine: >1.046 — ABNORMAL HIGH (ref 1.005–1.030)
pH: 6 (ref 5.0–8.0)

## 2022-09-25 LAB — I-STAT ARTERIAL BLOOD GAS, ED
Acid-base deficit: 3 mmol/L — ABNORMAL HIGH (ref 0.0–2.0)
Bicarbonate: 24.2 mmol/L (ref 20.0–28.0)
Calcium, Ion: 1.16 mmol/L (ref 1.15–1.40)
HCT: 29 % — ABNORMAL LOW (ref 39.0–52.0)
Hemoglobin: 9.9 g/dL — ABNORMAL LOW (ref 13.0–17.0)
O2 Saturation: 100 %
Patient temperature: 97.5
Potassium: 4.4 mmol/L (ref 3.5–5.1)
Sodium: 143 mmol/L (ref 135–145)
TCO2: 26 mmol/L (ref 22–32)
pCO2 arterial: 52.5 mmHg — ABNORMAL HIGH (ref 32–48)
pH, Arterial: 7.269 — ABNORMAL LOW (ref 7.35–7.45)
pO2, Arterial: 635 mmHg — ABNORMAL HIGH (ref 83–108)

## 2022-09-25 LAB — I-STAT CHEM 8, ED
BUN: 6 mg/dL (ref 6–20)
Calcium, Ion: 0.9 mmol/L — ABNORMAL LOW (ref 1.15–1.40)
Chloride: 110 mmol/L (ref 98–111)
Creatinine, Ser: 1 mg/dL (ref 0.61–1.24)
Glucose, Bld: 137 mg/dL — ABNORMAL HIGH (ref 70–99)
HCT: 30 % — ABNORMAL LOW (ref 39.0–52.0)
Hemoglobin: 10.2 g/dL — ABNORMAL LOW (ref 13.0–17.0)
Potassium: 4.5 mmol/L (ref 3.5–5.1)
Sodium: 144 mmol/L (ref 135–145)
TCO2: 19 mmol/L — ABNORMAL LOW (ref 22–32)

## 2022-09-25 LAB — BLOOD PRODUCT ORDER (VERBAL) VERIFICATION

## 2022-09-25 LAB — LACTIC ACID, PLASMA: Lactic Acid, Venous: 2.6 mmol/L (ref 0.5–1.9)

## 2022-09-25 LAB — PROTIME-INR
INR: 1.1 (ref 0.8–1.2)
Prothrombin Time: 14.5 seconds (ref 11.4–15.2)

## 2022-09-25 LAB — ABO/RH: ABO/RH(D): O NEG

## 2022-09-25 LAB — HIV ANTIBODY (ROUTINE TESTING W REFLEX): HIV Screen 4th Generation wRfx: REACTIVE — AB

## 2022-09-25 LAB — PREPARE RBC (CROSSMATCH)

## 2022-09-25 SURGERY — REPAIR, ARTERY, BRACHIAL
Anesthesia: General | Site: Arm Lower | Laterality: Left

## 2022-09-25 MED ORDER — DOCUSATE SODIUM 50 MG/5ML PO LIQD
100.0000 mg | Freq: Two times a day (BID) | ORAL | Status: DC
  Administered 2022-09-25: 100 mg
  Filled 2022-09-25: qty 10

## 2022-09-25 MED ORDER — PROPOFOL 1000 MG/100ML IV EMUL
INTRAVENOUS | Status: AC
  Filled 2022-09-25: qty 100

## 2022-09-25 MED ORDER — ROCURONIUM BROMIDE 100 MG/10ML IV SOLN
INTRAVENOUS | Status: DC | PRN
  Administered 2022-09-25: 50 mg via INTRAVENOUS
  Administered 2022-09-25: 100 mg via INTRAVENOUS

## 2022-09-25 MED ORDER — ALBUMIN HUMAN 5 % IV SOLN: INTRAVENOUS | Status: DC | PRN

## 2022-09-25 MED ORDER — ORAL CARE MOUTH RINSE: 15.0000 mL | Freq: Three times a day (TID) | OROMUCOSAL | Status: DC

## 2022-09-25 MED ORDER — ONDANSETRON HCL 4 MG/2ML IJ SOLN: 4.0000 mg | Freq: Four times a day (QID) | INTRAMUSCULAR | Status: DC | PRN

## 2022-09-25 MED ORDER — FENTANYL CITRATE (PF) 100 MCG/2ML IJ SOLN
INTRAMUSCULAR | Status: DC | PRN
  Administered 2022-09-25 (×5): 50 ug via INTRAVENOUS

## 2022-09-25 MED ORDER — FENTANYL CITRATE PF 50 MCG/ML IJ SOSY: 50.0000 ug | PREFILLED_SYRINGE | Freq: Once | INTRAMUSCULAR | Status: DC

## 2022-09-25 MED ORDER — MIDAZOLAM HCL 2 MG/2ML IJ SOLN
INTRAMUSCULAR | Status: AC
  Administered 2022-09-25: 2 mg via INTRAVENOUS
  Filled 2022-09-25: qty 4

## 2022-09-25 MED ORDER — MIDAZOLAM HCL 2 MG/2ML IJ SOLN
1.0000 mg | INTRAMUSCULAR | Status: DC | PRN
  Administered 2022-09-25: 2 mg via INTRAVENOUS

## 2022-09-25 MED ORDER — ENOXAPARIN SODIUM 30 MG/0.3ML IJ SOSY
30.0000 mg | PREFILLED_SYRINGE | Freq: Two times a day (BID) | INTRAMUSCULAR | Status: DC
  Administered 2022-09-25 – 2022-09-26 (×2): 30 mg via SUBCUTANEOUS
  Filled 2022-09-25 (×2): qty 0.3

## 2022-09-25 MED ORDER — FENTANYL 2500MCG IN NS 250ML (10MCG/ML) PREMIX INFUSION: 50.0000 ug/h | INTRAVENOUS | Status: DC

## 2022-09-25 MED ORDER — NICOTINE 14 MG/24HR TD PT24
14.0000 mg | MEDICATED_PATCH | Freq: Every day | TRANSDERMAL | Status: DC
  Administered 2022-09-25 – 2022-09-26 (×2): 14 mg via TRANSDERMAL
  Filled 2022-09-25 (×2): qty 1

## 2022-09-25 MED ORDER — PROPOFOL 1000 MG/100ML IV EMUL
0.0000 ug/kg/min | INTRAVENOUS | Status: DC
  Administered 2022-09-25: 25 ug/kg/min via INTRAVENOUS
  Filled 2022-09-25: qty 100

## 2022-09-25 MED ORDER — HEPARIN SODIUM (PORCINE) 1000 UNIT/ML IJ SOLN
INTRAMUSCULAR | Status: DC | PRN
  Administered 2022-09-25: 2000 [IU] via INTRAVENOUS
  Administered 2022-09-25: 3000 [IU] via INTRAVENOUS

## 2022-09-25 MED ORDER — VECURONIUM BROMIDE 10 MG IV SOLR
10.0000 mg | Freq: Once | INTRAVENOUS | Status: AC
  Administered 2022-09-25: 10 mg via INTRAVENOUS

## 2022-09-25 MED ORDER — ENOXAPARIN SODIUM 30 MG/0.3ML IJ SOSY: 30.0000 mg | PREFILLED_SYRINGE | Freq: Two times a day (BID) | INTRAMUSCULAR | Status: DC

## 2022-09-25 MED ORDER — KETOROLAC TROMETHAMINE 15 MG/ML IJ SOLN
30.0000 mg | Freq: Four times a day (QID) | INTRAMUSCULAR | Status: DC
  Administered 2022-09-25 – 2022-09-26 (×4): 30 mg via INTRAVENOUS
  Filled 2022-09-25 (×4): qty 2

## 2022-09-25 MED ORDER — TETANUS-DIPHTH-ACELL PERTUSSIS 5-2.5-18.5 LF-MCG/0.5 IM SUSY
0.5000 mL | PREFILLED_SYRINGE | Freq: Once | INTRAMUSCULAR | Status: AC
  Administered 2022-09-25: 0.5 mL via INTRAMUSCULAR
  Filled 2022-09-25: qty 0.5

## 2022-09-25 MED ORDER — ASPIRIN 325 MG PO TABS
325.0000 mg | ORAL_TABLET | Freq: Every day | ORAL | Status: DC
  Administered 2022-09-25: 325 mg
  Filled 2022-09-25: qty 1

## 2022-09-25 MED ORDER — PHENYLEPHRINE HCL-NACL 20-0.9 MG/250ML-% IV SOLN: 0.0000 ug/min | INTRAVENOUS | Status: DC

## 2022-09-25 MED ORDER — PANTOPRAZOLE SODIUM 40 MG IV SOLR
40.0000 mg | Freq: Every day | INTRAVENOUS | Status: DC
  Administered 2022-09-25: 40 mg via INTRAVENOUS
  Filled 2022-09-25: qty 10

## 2022-09-25 MED ORDER — HEPARIN 6000 UNIT IRRIGATION SOLUTION
Status: AC
  Filled 2022-09-25: qty 500

## 2022-09-25 MED ORDER — CHLORHEXIDINE GLUCONATE CLOTH 2 % EX PADS
6.0000 | MEDICATED_PAD | Freq: Every day | CUTANEOUS | Status: DC
  Administered 2022-09-25 – 2022-09-26 (×2): 6 via TOPICAL

## 2022-09-25 MED ORDER — LACTATED RINGERS IV SOLN: INTRAVENOUS | Status: DC

## 2022-09-25 MED ORDER — PHENYLEPHRINE HCL-NACL 20-0.9 MG/250ML-% IV SOLN
INTRAVENOUS | Status: DC | PRN
  Administered 2022-09-25: 40 ug/min via INTRAVENOUS

## 2022-09-25 MED ORDER — ACETAMINOPHEN 500 MG PO TABS
1000.0000 mg | ORAL_TABLET | Freq: Four times a day (QID) | ORAL | Status: DC
  Administered 2022-09-25 – 2022-09-26 (×4): 1000 mg via ORAL
  Filled 2022-09-25 (×4): qty 2

## 2022-09-25 MED ORDER — ETOMIDATE 2 MG/ML IV SOLN: 20.0000 mg | Freq: Once | INTRAVENOUS | Status: DC

## 2022-09-25 MED ORDER — OXYCODONE HCL 5 MG PO TABS: 5.0000 mg | ORAL_TABLET | ORAL | Status: DC | PRN

## 2022-09-25 MED ORDER — HEPARIN (PORCINE) 25000 UT/250ML-% IV SOLN: 500.0000 [IU]/h | INTRAVENOUS | Status: DC

## 2022-09-25 MED ORDER — ORAL CARE MOUTH RINSE: 15.0000 mL | OROMUCOSAL | Status: DC | PRN

## 2022-09-25 MED ORDER — LACTATED RINGERS IV SOLN: INTRAVENOUS | Status: DC | PRN

## 2022-09-25 MED ORDER — METHOCARBAMOL 500 MG PO TABS
1000.0000 mg | ORAL_TABLET | Freq: Three times a day (TID) | ORAL | Status: DC
  Administered 2022-09-25 – 2022-09-26 (×4): 1000 mg via ORAL
  Filled 2022-09-25 (×4): qty 2

## 2022-09-25 MED ORDER — CALCIUM CHLORIDE 10 % IV SOLN
INTRAVENOUS | Status: DC | PRN
  Administered 2022-09-25: 200 mg via INTRAVENOUS
  Administered 2022-09-25: 500 mg via INTRAVENOUS
  Administered 2022-09-25: 300 mg via INTRAVENOUS

## 2022-09-25 MED ORDER — CHLORHEXIDINE GLUCONATE 0.12 % MT SOLN
15.0000 mL | Freq: Once | OROMUCOSAL | Status: AC
  Administered 2022-09-25: 15 mL via OROMUCOSAL
  Filled 2022-09-25: qty 15

## 2022-09-25 MED ORDER — ACETAMINOPHEN 500 MG PO TABS: 1000.0000 mg | ORAL_TABLET | Freq: Four times a day (QID) | ORAL | Status: DC | PRN

## 2022-09-25 MED ORDER — POTASSIUM CHLORIDE CRYS ER 20 MEQ PO TBCR: 20.0000 meq | EXTENDED_RELEASE_TABLET | Freq: Every day | ORAL | Status: DC | PRN

## 2022-09-25 MED ORDER — PHENYLEPHRINE HCL (PRESSORS) 10 MG/ML IV SOLN
INTRAVENOUS | Status: DC | PRN
  Administered 2022-09-25 (×2): 160 ug via INTRAVENOUS

## 2022-09-25 MED ORDER — PROPOFOL 10 MG/ML IV BOLUS
INTRAVENOUS | Status: AC
  Filled 2022-09-25: qty 20

## 2022-09-25 MED ORDER — MIDAZOLAM HCL 2 MG/2ML IJ SOLN
INTRAMUSCULAR | Status: AC
  Filled 2022-09-25: qty 2

## 2022-09-25 MED ORDER — DEXMEDETOMIDINE HCL IN NACL 400 MCG/100ML IV SOLN: 0.0000 ug/kg/h | INTRAVENOUS | Status: DC

## 2022-09-25 MED ORDER — MORPHINE SULFATE (PF) 2 MG/ML IV SOLN: 2.0000 mg | INTRAVENOUS | Status: DC | PRN

## 2022-09-25 MED ORDER — VECURONIUM BROMIDE 10 MG IV SOLR
INTRAVENOUS | Status: AC
  Filled 2022-09-25: qty 10

## 2022-09-25 MED ORDER — PROPOFOL 500 MG/50ML IV EMUL
INTRAVENOUS | Status: DC | PRN
  Administered 2022-09-25: 75 ug/kg/min via INTRAVENOUS

## 2022-09-25 MED ORDER — FENTANYL 2500MCG IN NS 250ML (10MCG/ML) PREMIX INFUSION
INTRAVENOUS | Status: AC
  Administered 2022-09-25: 100 ug via INTRAVENOUS
  Filled 2022-09-25: qty 250

## 2022-09-25 MED ORDER — 0.9 % SODIUM CHLORIDE (POUR BTL) OPTIME
TOPICAL | Status: DC | PRN
  Administered 2022-09-25: 1000 mL

## 2022-09-25 MED ORDER — ROCURONIUM BROMIDE 10 MG/ML (PF) SYRINGE
PREFILLED_SYRINGE | INTRAVENOUS | Status: AC
  Filled 2022-09-25: qty 20

## 2022-09-25 MED ORDER — FENTANYL CITRATE (PF) 250 MCG/5ML IJ SOLN
INTRAMUSCULAR | Status: AC
  Filled 2022-09-25: qty 5

## 2022-09-25 MED ORDER — ONDANSETRON HCL 4 MG/2ML IJ SOLN: 4.0000 mg | Freq: Once | INTRAMUSCULAR | Status: DC

## 2022-09-25 MED ORDER — GABAPENTIN 300 MG PO CAPS
300.0000 mg | ORAL_CAPSULE | Freq: Three times a day (TID) | ORAL | Status: DC
  Administered 2022-09-25 – 2022-09-26 (×3): 300 mg via ORAL
  Filled 2022-09-25 (×3): qty 1

## 2022-09-25 MED ORDER — HEMOSTATIC AGENTS (NO CHARGE) OPTIME
TOPICAL | Status: DC | PRN
  Administered 2022-09-25: 2 via TOPICAL

## 2022-09-25 MED ORDER — FENTANYL BOLUS VIA INFUSION: 50.0000 ug | INTRAVENOUS | Status: DC | PRN

## 2022-09-25 MED ORDER — ASPIRIN 325 MG PO TBEC: 325.0000 mg | DELAYED_RELEASE_TABLET | Freq: Every day | ORAL | Status: DC

## 2022-09-25 MED ORDER — SODIUM CHLORIDE 0.9 % IV BOLUS: 1000.0000 mL | Freq: Once | INTRAVENOUS | Status: DC

## 2022-09-25 MED ORDER — PHENYLEPHRINE 80 MCG/ML (10ML) SYRINGE FOR IV PUSH (FOR BLOOD PRESSURE SUPPORT)
PREFILLED_SYRINGE | INTRAVENOUS | Status: AC
  Filled 2022-09-25: qty 10

## 2022-09-25 MED ORDER — ONDANSETRON 4 MG PO TBDP: 4.0000 mg | ORAL_TABLET | Freq: Four times a day (QID) | ORAL | Status: DC | PRN

## 2022-09-25 MED ORDER — PANTOPRAZOLE SODIUM 40 MG PO TBEC: 40.0000 mg | DELAYED_RELEASE_TABLET | Freq: Every day | ORAL | Status: DC

## 2022-09-25 MED ORDER — SODIUM CHLORIDE 0.9% IV SOLUTION: Freq: Once | INTRAVENOUS | Status: DC

## 2022-09-25 MED ORDER — HEPARIN 6000 UNIT IRRIGATION SOLUTION
Status: DC | PRN
  Administered 2022-09-25: 1

## 2022-09-25 MED ORDER — SUCCINYLCHOLINE CHLORIDE 200 MG/10ML IV SOSY: 100.0000 mg | PREFILLED_SYRINGE | Freq: Once | INTRAVENOUS | Status: DC

## 2022-09-25 MED ORDER — ORAL CARE MOUTH RINSE
15.0000 mL | OROMUCOSAL | Status: DC
  Administered 2022-09-25 (×5): 15 mL via OROMUCOSAL

## 2022-09-25 MED ORDER — IOHEXOL 350 MG/ML SOLN
100.0000 mL | Freq: Once | INTRAVENOUS | Status: AC | PRN
  Administered 2022-09-25: 100 mL via INTRAVENOUS

## 2022-09-25 MED ORDER — SODIUM CHLORIDE 0.9 % IV SOLN: INTRAVENOUS | Status: DC

## 2022-09-25 MED ORDER — POLYETHYLENE GLYCOL 3350 17 G PO PACK
17.0000 g | PACK | Freq: Every day | ORAL | Status: DC
  Administered 2022-09-25: 17 g
  Filled 2022-09-25: qty 1

## 2022-09-25 MED ORDER — CEFAZOLIN SODIUM-DEXTROSE 2-4 GM/100ML-% IV SOLN
2.0000 g | Freq: Once | INTRAVENOUS | Status: AC
  Administered 2022-09-25: 2 g via INTRAVENOUS
  Filled 2022-09-25: qty 100

## 2022-09-25 MED ORDER — SODIUM CHLORIDE 0.9 % IV SOLN: INTRAVENOUS | Status: DC | PRN

## 2022-09-25 MED ORDER — POLYETHYLENE GLYCOL 3350 17 G PO PACK: 17.0000 g | PACK | Freq: Every day | ORAL | Status: DC | PRN

## 2022-09-25 MED ORDER — MIDAZOLAM HCL 5 MG/5ML IJ SOLN
INTRAMUSCULAR | Status: DC | PRN
  Administered 2022-09-25: 2 mg via INTRAVENOUS

## 2022-09-25 SURGICAL SUPPLY — 48 items
BAG COUNTER SPONGE SURGICOUNT (BAG) ×1 IMPLANT
BANDAGE ESMARK 6X9 LF (GAUZE/BANDAGES/DRESSINGS) IMPLANT
BNDG ELASTIC 4X5.8 VLCR STR LF (GAUZE/BANDAGES/DRESSINGS) ×1 IMPLANT
BNDG ESMARK 4X9 LF (GAUZE/BANDAGES/DRESSINGS) IMPLANT
BNDG ESMARK 6X9 LF (GAUZE/BANDAGES/DRESSINGS) ×1
BNDG GAUZE DERMACEA FLUFF 4 (GAUZE/BANDAGES/DRESSINGS) IMPLANT
CANISTER SUCT 3000ML PPV (MISCELLANEOUS) ×1 IMPLANT
CATH EMB 2FR 60CM (CATHETERS) IMPLANT
CLIP VESOCCLUDE MED 6/CT (CLIP) ×1 IMPLANT
CUFF TOURN SGL QUICK 18X4 (TOURNIQUET CUFF) IMPLANT
CUFF TOURN SGL QUICK 24 (TOURNIQUET CUFF)
CUFF TRNQT CYL 24X4X16.5-23 (TOURNIQUET CUFF) IMPLANT
DERMABOND ADVANCED .7 DNX12 (GAUZE/BANDAGES/DRESSINGS) ×1 IMPLANT
DRAPE INCISE IOBAN 66X45 STRL (DRAPES) IMPLANT
DRSG ADAPTIC 3X8 NADH LF (GAUZE/BANDAGES/DRESSINGS) IMPLANT
ELECT REM PT RETURN 9FT ADLT (ELECTROSURGICAL) ×1
ELECTRODE REM PT RTRN 9FT ADLT (ELECTROSURGICAL) ×1 IMPLANT
GAUZE SPONGE 4X4 12PLY STRL (GAUZE/BANDAGES/DRESSINGS) IMPLANT
GLOVE BIOGEL PI IND STRL 8 (GLOVE) ×1 IMPLANT
GLOVE INDICATOR 7.5 STRL GRN (GLOVE) IMPLANT
GLOVE SURG SS PI 7.5 STRL IVOR (GLOVE) IMPLANT
GLOVE SURG SYN 8.0 (GLOVE) ×1 IMPLANT
GLOVE SURG SYN 8.0 PF PI (GLOVE) IMPLANT
GOWN STRL REUS W/ TWL LRG LVL3 (GOWN DISPOSABLE) ×2 IMPLANT
GOWN STRL REUS W/TWL 2XL LVL3 (GOWN DISPOSABLE) ×2 IMPLANT
GOWN STRL REUS W/TWL LRG LVL3 (GOWN DISPOSABLE) ×3
HEMOSTAT SNOW SURGICEL 2X4 (HEMOSTASIS) IMPLANT
KIT BASIN OR (CUSTOM PROCEDURE TRAY) ×1 IMPLANT
KIT TURNOVER KIT B (KITS) ×1 IMPLANT
NS IRRIG 1000ML POUR BTL (IV SOLUTION) ×1 IMPLANT
PACK CV ACCESS (CUSTOM PROCEDURE TRAY) ×1 IMPLANT
PAD ARMBOARD 7.5X6 YLW CONV (MISCELLANEOUS) ×2 IMPLANT
SPIKE FLUID TRANSFER (MISCELLANEOUS) IMPLANT
SPONGE T-LAP 18X18 ~~LOC~~+RFID (SPONGE) IMPLANT
SUT CHROMIC 3 0 SH 27 (SUTURE) IMPLANT
SUT CHROMIC 4 0 PS 5 (SUTURE) IMPLANT
SUT ETHILON 8 0 BV130 4 (SUTURE) IMPLANT
SUT MNCRL AB 4-0 PS2 18 (SUTURE) ×1 IMPLANT
SUT PROLENE 5 0 C 1 24 (SUTURE) IMPLANT
SUT PROLENE 6 0 BV (SUTURE) ×1 IMPLANT
SUT PROLENE 6 0 CC (SUTURE) IMPLANT
SUT PROLENE 7 0 BV 1 (SUTURE) IMPLANT
SUT PROLENE 7 0 BV1 MDA (SUTURE) IMPLANT
SUT VIC AB 3-0 SH 27 (SUTURE) ×1
SUT VIC AB 3-0 SH 27X BRD (SUTURE) ×1 IMPLANT
TOWEL GREEN STERILE (TOWEL DISPOSABLE) ×1 IMPLANT
UNDERPAD 30X36 HEAVY ABSORB (UNDERPADS AND DIAPERS) ×1 IMPLANT
WATER STERILE IRR 1000ML POUR (IV SOLUTION) ×1 IMPLANT
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
## 2022-09-25 NOTE — Progress Notes (Signed)
RT and RN transported vent patient to CT and back to ED. Vital signs stable through.
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
## 2022-09-25 NOTE — Progress Notes (Signed)
Pt seen postop, excellent signal in the fingers.  Heparin d/c'd, continue 325 ASA  Broadus John MD

## 2022-09-25 NOTE — Progress Notes (Signed)
PT Cancellation Note  Patient Details Name: Jerry Finley MRN: [id number] DOB: 09-26-1995   Cancelled Treatment:    Reason Eval/Treat Not Completed: Patient not medically ready. Pt intubated and sedated. Will follow for readiness.   Shary Decamp [facility] 09/25/2022, 10:28 AM [place] Office [phone]

## 2022-09-25 NOTE — Progress Notes (Signed)
Pt reported hunger, given a lean cuisine from nutrition room.

## 2022-09-25 NOTE — Procedures (Signed)
Laceration Repair  Date/Time: 09/25/2022 3:18 AM  Performed by: Georganna Skeans, MD Authorized by: Georganna Skeans, MD   Consent:    Consent obtained:  Emergent situation Universal protocol:    Patient identity confirmed:  Hospital-assigned identification number and arm band Laceration details:    Location:  Trunk   Length (cm):  1.5 Pre-procedure details:    Preparation:  Patient was prepped and draped in usual sterile fashion and imaging obtained to evaluate for foreign bodies Exploration:    Hemostasis achieved with:  Direct pressure Treatment:    Debridement:  None Skin repair:    Repair method:  Staples Post-procedure details:    Dressing:  Bulky dressing   Procedure completion:  Tolerated well, no immediate complications  L back 5.6CL SW  Georganna Skeans, MD, MPH, FACS Please use [email or web address] to contact on call provider
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
## 2022-09-25 NOTE — Transfer of Care (Signed)
Immediate Anesthesia Transfer of Care Note  Patient: Jerry Finley  Procedure(s) Performed: EXPLORATION AND REPAIR Radial Artery  LEFT ARM, nerve repair. (Left: Arm Lower)  Patient Location: ICU  Anesthesia Type:General  Level of Consciousness: Patient remains intubated per anesthesia plan  Airway & Oxygen Therapy: Patient remains intubated per anesthesia plan and Patient placed on Ventilator (see vital sign flow sheet for setting)  Post-op Assessment: Report given to RN and Post -op Vital signs reviewed and stable  Post vital signs: Reviewed and stable  Last Vitals:  Vitals Value Taken Time  BP 114/63 09/25/22 0700  Temp 34.9 C 09/25/22 0700  Pulse 57 09/25/22 0700  Resp 16 09/25/22 0700  SpO2 100 % 09/25/22 0700  Vitals shown include unvalidated device data.  Last Pain: There were no vitals filed for this visit.       Complications: No notable events documented.

## 2022-09-25 NOTE — Progress Notes (Signed)
Pt says he has an appointment tomorrow to meet with his parole officer, and hasn't been able to call them d/t not having his cell phone. Cell phone was not with him on arrival to hospital. Day shift RN secure chat messaged SW Ricquita about this, but no response yet. Pt says he hopes to d/c tomorrow so he won't miss the appointment.

## 2022-09-25 NOTE — Progress Notes (Signed)
RT and RN transported vent patient to OR.
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
## 2022-09-25 NOTE — Procedures (Signed)
Extubation Procedure Note  Patient Details:   Name: Jerry Finley DOB: 08-08-1996 MRN: [id number]   Airway Documentation:    Vent end date: 09/25/22 Vent end time: 1410   Evaluation  O2 sats: stable throughout Complications: No apparent complications Patient did tolerate procedure well. Bilateral Breath Sounds: Clear, Diminished   Yes  Positive for cuff leak, able to lift head off pillow, weaned PS 5/5. RT extubated pt to 4L [place]. Pt able to speak and did IS 2546ml. No complications noted.   Patsy Baltimore Tadeusz Stahl 09/25/2022, 2:24 PM
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
## 2022-09-25 NOTE — TOC CAGE-AID Note (Signed)
Transition of Care [facility]) - CAGE-AID Screening   Patient Details  Name: Jerry Finley MRN: [id number] Date of Birth: 07/29/1996  [facility] of [facility]) CM/SW Contact:    Army Melia, RN Phone [phone] 09/25/2022, 5:15 AM   Clinical Narrative: Presently intubated for [facility] (1-Rh). Hx of IV drug abuse, would benefit from rescreening when extubated and alert.   CAGE-AID Screening: Substance Abuse Screening unable to be completed due to: : Patient unable to participate

## 2022-09-25 NOTE — Progress Notes (Signed)
Orthopedic Tech Progress Note Patient Details:  Jerry Finley [birth year] [id number]  Patient ID: Angelina Pih, male   DOB: 16-Mar-1996, 27 y.o.   MRN: [id number] Level I; not currently needed. Vernona Rieger 09/25/2022, 2:37 AM
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
## 2022-09-26 ENCOUNTER — Encounter: Payer: Self-pay | Admitting: Physician Assistant

## 2022-09-26 ENCOUNTER — Encounter (HOSPITAL_COMMUNITY): Payer: Self-pay | Admitting: Vascular Surgery

## 2022-09-26 ENCOUNTER — Telehealth: Payer: Self-pay | Admitting: Infectious Disease

## 2022-09-26 DIAGNOSIS — S51812A Laceration without foreign body of left forearm, initial encounter: Secondary | ICD-10-CM | POA: Diagnosis not present

## 2022-09-26 DIAGNOSIS — S61502A Unspecified open wound of left wrist, initial encounter: Secondary | ICD-10-CM | POA: Diagnosis not present

## 2022-09-26 DIAGNOSIS — S20402A Unspecified superficial injuries of left back wall of thorax, initial encounter: Secondary | ICD-10-CM | POA: Diagnosis not present

## 2022-09-26 LAB — CBC
HCT: 36.1 % — ABNORMAL LOW (ref 39.0–52.0)
Hemoglobin: 12.1 g/dL — ABNORMAL LOW (ref 13.0–17.0)
MCH: 30.6 pg (ref 26.0–34.0)
MCHC: 33.5 g/dL (ref 30.0–36.0)
MCV: 91.2 fL (ref 80.0–100.0)
Platelets: 187 10*3/uL (ref 150–400)
RBC: 3.96 MIL/uL — ABNORMAL LOW (ref 4.22–5.81)
RDW: 13.2 % (ref 11.5–15.5)
WBC: 6.6 10*3/uL (ref 4.0–10.5)
nRBC: 0 % (ref 0.0–0.2)

## 2022-09-26 LAB — PREPARE FRESH FROZEN PLASMA
Unit division: 0
Unit division: 0
Unit division: 0
Unit division: 0

## 2022-09-26 LAB — BPAM FFP
Blood Product Expiration Date: 202401162359
Blood Product Expiration Date: 202401182359
Blood Product Expiration Date: 202401182359
Blood Product Expiration Date: 202401182359
ISSUE DATE / TIME: 202401160306
ISSUE DATE / TIME: 202401160308
ISSUE DATE / TIME: 202401160437
ISSUE DATE / TIME: 202401160437
Unit Type and Rh: 5100
Unit Type and Rh: 5100
Unit Type and Rh: 6200
Unit Type and Rh: 6200

## 2022-09-26 LAB — BASIC METABOLIC PANEL
Anion gap: 8 (ref 5–15)
BUN: 6 mg/dL (ref 6–20)
CO2: 26 mmol/L (ref 22–32)
Calcium: 7.7 mg/dL — ABNORMAL LOW (ref 8.9–10.3)
Chloride: 106 mmol/L (ref 98–111)
Creatinine, Ser: 0.71 mg/dL (ref 0.61–1.24)
GFR, Estimated: >60 mL/min (ref >60)
Glucose, Bld: 84 mg/dL (ref 70–99)
Potassium: 3.7 mmol/L (ref 3.5–5.1)
Sodium: 140 mmol/L (ref 135–145)

## 2022-09-26 MED ORDER — ASPIRIN 325 MG PO TABS
325.0000 mg | ORAL_TABLET | Freq: Every day | ORAL | Status: DC
  Administered 2022-09-26: 325 mg via ORAL
  Filled 2022-09-26: qty 1

## 2022-09-26 MED ORDER — BICTEGRAVIR-EMTRICITAB-TENOFOV 50-200-25 MG PO TABS
1.0000 | ORAL_TABLET | Freq: Every day | ORAL | Status: DC
  Administered 2022-09-26: 1 via ORAL
  Filled 2022-09-26: qty 1

## 2022-09-26 MED ORDER — POLYETHYLENE GLYCOL 3350 17 G PO PACK: 17.0000 g | PACK | Freq: Every day | ORAL | Status: DC

## 2022-09-26 MED ORDER — MORPHINE SULFATE (PF) 2 MG/ML IV SOLN: 2.0000 mg | INTRAVENOUS | Status: DC | PRN

## 2022-09-26 MED ORDER — GABAPENTIN 300 MG PO CAPS: 300.0000 mg | ORAL_CAPSULE | Freq: Three times a day (TID) | ORAL | 0 refills | Status: DC

## 2022-09-26 MED ORDER — ACETAMINOPHEN 500 MG PO TABS: 1000.0000 mg | ORAL_TABLET | Freq: Four times a day (QID) | ORAL | 0 refills | Status: DC | PRN

## 2022-09-26 MED ORDER — OXYCODONE HCL 5 MG PO TABS: 5.0000 mg | ORAL_TABLET | ORAL | Status: DC | PRN

## 2022-09-26 MED ORDER — METHOCARBAMOL 500 MG PO TABS: 1000.0000 mg | ORAL_TABLET | Freq: Three times a day (TID) | ORAL | 0 refills | Status: DC | PRN

## 2022-09-26 MED ORDER — ASPIRIN 325 MG PO TABS: 325.0000 mg | ORAL_TABLET | Freq: Every day | ORAL | 0 refills | Status: DC

## 2022-09-26 MED ORDER — POLYETHYLENE GLYCOL 3350 17 G PO PACK: 17.0000 g | PACK | Freq: Every day | ORAL | 0 refills | Status: DC | PRN

## 2022-09-26 MED ORDER — DOCUSATE SODIUM 100 MG PO CAPS
100.0000 mg | ORAL_CAPSULE | Freq: Two times a day (BID) | ORAL | Status: DC
  Filled 2022-09-26: qty 1
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
## 2022-09-26 NOTE — Progress Notes (Signed)
  Progress Note    09/26/2022 8:26 AM 1 Day Post-Op  Subjective:  says he doesn't feel too shabby  afebrile 95% RA  Vitals:   09/26/22 0600 09/26/22 0756  BP:    Pulse: 73   Resp: 18   Temp:  98.4 F (36.9 C)  SpO2: 95%     Physical Exam: General:  no distress; resting comfortably Lungs:  non labored Incisions:  bandaged left forearm Extremities:  brisk doppler flow left ulnar/radial and palmar arch.  No sensation in the 1st, middle fingers; sensation in ring and 5th finger.  Able to wiggle fingers.    CBC    Component Value Date/Time   WBC 6.6 09/26/2022 0610   RBC 3.96 (L) 09/26/2022 0610   HGB 12.1 (L) 09/26/2022 0610   HCT 36.1 (L) 09/26/2022 0610   PLT 187 09/26/2022 0610   MCV 91.2 09/26/2022 0610   MCH 30.6 09/26/2022 0610   MCHC 33.5 09/26/2022 0610   RDW 13.2 09/26/2022 0610    BMET    Component Value Date/Time   NA 140 09/26/2022 0610   K 3.7 09/26/2022 0610   CL 106 09/26/2022 0610   CO2 26 09/26/2022 0610   GLUCOSE 84 09/26/2022 0610   BUN 6 09/26/2022 0610   CREATININE 0.71 09/26/2022 0610   CALCIUM 7.7 (L) 09/26/2022 0610   GFRNONAA >60 09/26/2022 0610    INR    Component Value Date/Time   INR 1.1 09/25/2022 0235     Intake/Output Summary (Last 24 hours) at 09/26/2022 0826 Last data filed at 09/26/2022 0600 Gross per 24 hour  Intake 2027.07 ml  Output 1340 ml  Net 687.07 ml       Assessment/Plan:  27 y.o. male is s/p:  Left forearm exploration Radial artery primary repair with end to end anastomosis using interrupted 7-0 Prolene suture (Dr. Virl Cagey) And  Left forearm radial sensory nerve repair Left forearm brachioradialis tenotomy Complex wound closure of traumatic wounds, total length 20cm (Dr. Greta Doom) 1 Day Post-Op   -pt with brisk left ulnar/radial/palmar arch signals; able to wiggle fingers; sensation not in tact 1st and 2nd fingers.  Most likely would benefit from OT/PT consult and outpatient therapy. -heparin gtt  discontinued yesterday and pt will be maintained on asa 325mg  daily.  Discussed this with pt. -will have pt follow up in 2-3 weeks    Leontine Locket, PA-C [facility] [phone] 09/26/2022 8:26 AM  VASCULAR STAFF ADDENDUM: I have independently interviewed and examined the patient. I agree with the above.  Motor intact, sensory deficits in the median nerve distribution Excellent signals Overall pleasant guy, kind to staff.  ROM PT/OT per ortho hand No restrictions from vascular surgery standpoint other than 5lb weight limit Will continue to follow intermittently.  Continue ASA  Cassandria Santee, MD [facility] Phone Number: [phone] 09/26/2022 8:26 AM
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
## 2022-09-26 NOTE — Anesthesia Postprocedure Evaluation (Signed)
Anesthesia Post Note  Patient: Jerry Finley  Procedure(s) Performed: EXPLORATION AND REPAIR Radial Artery  LEFT ARM, nerve repair. (Left: Arm Lower)     Patient location during evaluation: ICU Anesthesia Type: General Level of consciousness: sedated and patient remains intubated per anesthesia plan Pain management: pain level controlled Vital Signs Assessment: post-procedure vital signs reviewed and stable Respiratory status: patient remains intubated per anesthesia plan Cardiovascular status: stable Postop Assessment: no apparent nausea or vomiting Anesthetic complications: no   No notable events documented.  Last Vitals:  Vitals:   09/26/22 1116 09/26/22 1200  BP:  (!) 132/100  Pulse: 74 76  Resp: (!) 0 18  Temp: 36.9 C   SpO2: 92% 100%    Last Pain:  Vitals:   09/26/22 1200  TempSrc:   PainSc: Jerry Finley
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
## 2022-09-26 NOTE — Progress Notes (Signed)
  Progress Note    09/26/2022 6:29 AM 1 Day Post-Op  Subjective:  says he doesn't feel too shabby  afebrile 95% RA  Vitals:   09/26/22 0559 09/26/22 0600  BP:    Pulse: 77 73  Resp: 17 18  Temp:    SpO2: 97% 95%    Physical Exam: General:  no distress; resting comfortably Lungs:  non labored Incisions:  bandaged left forearm Extremities:  brisk doppler flow left ulnar/radial and palmar arch.  No sensation in the 1st, middle fingers; sensation in ring and 5th finger.  Able to wiggle fingers.    CBC    Component Value Date/Time   WBC 6.8 09/25/2022 0235   RBC 3.51 (L) 09/25/2022 0235   HGB 9.9 (L) 09/25/2022 0332   HCT 29.0 (L) 09/25/2022 0332   PLT 211 09/25/2022 0235   MCV 94.0 09/25/2022 0235   MCH 30.8 09/25/2022 0235   MCHC 32.7 09/25/2022 0235   RDW 12.3 09/25/2022 0235    BMET    Component Value Date/Time   NA 143 09/25/2022 0332   K 4.4 09/25/2022 0332   CL 110 09/25/2022 0236   CO2 19 (L) 09/25/2022 0235   GLUCOSE 137 (H) 09/25/2022 0236   BUN 6 09/25/2022 0236   CREATININE 1.00 09/25/2022 0236   CALCIUM 7.3 (L) 09/25/2022 0235   GFRNONAA >60 09/25/2022 0235    INR    Component Value Date/Time   INR 1.1 09/25/2022 0235     Intake/Output Summary (Last 24 hours) at 09/26/2022 0629 Last data filed at 09/26/2022 0600 Gross per 24 hour  Intake 2027.07 ml  Output 1440 ml  Net 587.07 ml      Assessment/Plan:  27 y.o. male is s/p:  Left forearm exploration Radial artery primary repair with end to end anastomosis using interrupted 7-0 Prolene suture (Dr. Virl Cagey) And  Left forearm radial sensory nerve repair Left forearm brachioradialis tenotomy Complex wound closure of traumatic wounds, total length 20cm (Dr. Greta Doom) 1 Day Post-Op   -pt with brisk left ulnar/radial/palmar arch signals; able to wiggle fingers; sensation not in tact 1st and 2nd fingers.  Most likely would benefit from OT/PT consult and outpatient therapy. -heparin gtt  discontinued yesterday and pt will be maintained on asa 325mg  daily.  Discussed this with pt. -will have pt follow up in 2-3 weeks    Leontine Locket, PA-C [facility] [phone] 09/26/2022 6:29 AM

## 2022-09-26 NOTE — Discharge Instructions (Addendum)
Vascular Surgery would like you to take an Aspirin 325mg  daily.  Please follow up with Vascular Surgery and Orthopedic Surgery as outlined in your discharge follow up section. Please call their offices with any questions or concerns related to your surgery.  Please do not lift greater than 5lbs with the left upper extremity until your are cleared to do so by vascular surgery.  We are arranging outpatient Occupational Therapy - please follow up with your Orthopedic surgeon  Ask your surgeon to remove the staples from your back as well.

## 2022-09-26 NOTE — Telephone Encounter (Signed)
I see note about this being duplicate + test. Is this pt in care or newly dx?
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
## 2022-09-26 NOTE — Progress Notes (Signed)
PT Cancellation Note  Patient Details Name: CHASIN FINDLING MRN: [id number] DOB: 1995/12/21   Cancelled Treatment:    Reason Eval/Treat Not Completed: PT screened, no needs identified, will sign off (OT evaluated; pt independent)  Wyona Almas, PT, DPT [facility] 09/26/2022, 9:54 AM

## 2022-09-27 LAB — HIV-1/2 AB - DIFFERENTIATION
HIV 1 Ab: REACTIVE — AB
HIV 2 Ab: NONREACTIVE

## 2022-09-28 ENCOUNTER — Telehealth: Payer: Self-pay | Admitting: *Deleted

## 2022-09-28 LAB — TYPE AND SCREEN
ABO/RH(D): O NEG
Antibody Screen: NEGATIVE
Unit division: 0
Unit division: 0
Unit division: 0
Unit division: 0
Unit division: 0
Unit division: 0
Unit division: 0
Unit division: 0
Unit division: 0
Unit division: 0

## 2022-09-28 LAB — BPAM RBC
Blood Product Expiration Date: 202401282359
Blood Product Expiration Date: 202402012359
Blood Product Expiration Date: 202402042359
Blood Product Expiration Date: 202402102359
Blood Product Expiration Date: 202402102359
Blood Product Expiration Date: 202402102359
Blood Product Expiration Date: 202402102359
Blood Product Expiration Date: 202402102359
Blood Product Expiration Date: 202402102359
Blood Product Expiration Date: 202402102359
ISSUE DATE / TIME: 202401130030
ISSUE DATE / TIME: 202401160301
ISSUE DATE / TIME: 202401160301
ISSUE DATE / TIME: 202401160429
ISSUE DATE / TIME: 202401160429
ISSUE DATE / TIME: 202401160429
ISSUE DATE / TIME: 202401180817
ISSUE DATE / TIME: 202401181747
ISSUE DATE / TIME: 202401181828
ISSUE DATE / TIME: 202401182209
Unit Type and Rh: 5100
Unit Type and Rh: 5100
Unit Type and Rh: 5100
Unit Type and Rh: 5100
Unit Type and Rh: 5100
Unit Type and Rh: 5100
Unit Type and Rh: 5100
Unit Type and Rh: 5100
Unit Type and Rh: 5100
Unit Type and Rh: 5100

## 2022-09-28 NOTE — Patient Outreach (Signed)
  Care Coordination [facility] Note Transition Care Management Unsuccessful Follow-up Telephone Call  Date of discharge and from where:  09/26/22 from [facility]  Attempts:  1st Attempt  Reason for unsuccessful TCM follow-up call:  Unable to leave message   Lurena Joiner RN, [place] RN Care Coordinator

## 2022-10-03 ENCOUNTER — Ambulatory Visit: Payer: Medicaid Other | Attending: Physician Assistant | Admitting: Occupational Therapy

## 2022-10-07 DIAGNOSIS — S41112A Laceration without foreign body of left upper arm, initial encounter: Secondary | ICD-10-CM | POA: Diagnosis not present

## 2022-10-07 DIAGNOSIS — Z4802 Encounter for removal of sutures: Secondary | ICD-10-CM | POA: Diagnosis not present

## 2022-10-07 DIAGNOSIS — B370 Candidal stomatitis: Secondary | ICD-10-CM | POA: Diagnosis not present

## 2022-11-05 ENCOUNTER — Other Ambulatory Visit: Payer: Self-pay

## 2022-11-08 ENCOUNTER — Other Ambulatory Visit: Payer: Self-pay

## 2022-11-08 ENCOUNTER — Other Ambulatory Visit (HOSPITAL_COMMUNITY): Payer: Self-pay

## 2022-11-14 ENCOUNTER — Other Ambulatory Visit: Payer: Self-pay

## 2022-11-14 ENCOUNTER — Other Ambulatory Visit (HOSPITAL_COMMUNITY): Payer: Self-pay

## 2022-11-15 ENCOUNTER — Telehealth: Payer: Self-pay

## 2022-11-15 NOTE — Telephone Encounter (Signed)
[facility] Patient Advocate Encounter  Patient's medication Kern Reap) have been couriered to [facility] from [facility] and will be administered on the patient next office visit.  Ileene Patrick , [facility] Patient [facility] Phone: [phone] Fax:  [phone]

## 2022-11-21 ENCOUNTER — Ambulatory Visit (INDEPENDENT_AMBULATORY_CARE_PROVIDER_SITE_OTHER): Payer: Medicaid Other

## 2022-11-21 ENCOUNTER — Ambulatory Visit (HOSPITAL_COMMUNITY)
Admission: EM | Admit: 2022-11-21 | Discharge: 2022-11-21 | Disposition: A | Discharge status: Home / Self Care | Payer: Medicaid Other | Attending: Family Medicine | Admitting: Family Medicine

## 2022-11-21 ENCOUNTER — Encounter (HOSPITAL_COMMUNITY): Payer: Self-pay

## 2022-11-21 DIAGNOSIS — S61512A Laceration without foreign body of left wrist, initial encounter: Secondary | ICD-10-CM

## 2022-11-21 DIAGNOSIS — S6992XA Unspecified injury of left wrist, hand and finger(s), initial encounter: Secondary | ICD-10-CM | POA: Diagnosis not present

## 2022-11-21 MED ORDER — AMOXICILLIN-POT CLAVULANATE 875-125 MG PO TABS: 1.0000 | ORAL_TABLET | Freq: Two times a day (BID) | ORAL | 0 refills | Status: DC
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
## 2022-11-22 DIAGNOSIS — Z141 Cystic fibrosis carrier: Secondary | ICD-10-CM | POA: Diagnosis not present

## 2022-11-23 ENCOUNTER — Ambulatory Visit: Payer: Medicaid Other | Admitting: Physician Assistant

## 2022-11-23 ENCOUNTER — Telehealth: Payer: Self-pay

## 2022-11-23 NOTE — Telephone Encounter (Signed)
Attempted to call patient regarding appointment today. Per Claiborne Billings, [place] can offer later appointment today if he is able to make it. Call cannot be completed at either number listed. Attempted to call patient's father, but no answer. Voicemail is not set up.  Called [facility] for additional contact information, but no alternative number listed. Leatrice Jewels, RMA

## 2022-12-17 ENCOUNTER — Ambulatory Visit: Payer: Medicaid Other | Admitting: Physician Assistant

## 2023-01-18 ENCOUNTER — Other Ambulatory Visit: Payer: Self-pay

## 2023-01-18 ENCOUNTER — Ambulatory Visit: Payer: Medicaid Other | Admitting: Physician Assistant

## 2023-01-18 ENCOUNTER — Encounter: Payer: Self-pay | Admitting: Physician Assistant

## 2023-01-18 VITALS — BP 137/88 | HR 63 | Ht 67.0 in | Wt 144.0 lb

## 2023-01-18 DIAGNOSIS — M79602 Pain in left arm: Secondary | ICD-10-CM

## 2023-01-18 DIAGNOSIS — Z0000 Encounter for general adult medical examination without abnormal findings: Secondary | ICD-10-CM | POA: Diagnosis not present

## 2023-01-18 DIAGNOSIS — B20 Human immunodeficiency virus [HIV] disease: Secondary | ICD-10-CM | POA: Diagnosis not present

## 2023-01-18 DIAGNOSIS — R200 Anesthesia of skin: Secondary | ICD-10-CM

## 2023-01-18 MED ORDER — BIKTARVY 50-200-25 MG PO TABS: 1.0000 | ORAL_TABLET | Freq: Every day | ORAL | 5 refills | Status: AC
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
## 2023-01-18 NOTE — Patient Instructions (Addendum)
Gilson if your viral load is suppressed we will set up Cabanuva otherwise we can recheck in 1 month Biktarvy sent to pharmacy Referral to family practice who can get you set up with physical therapy Renew medicaid
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
## 2023-01-21 LAB — T-HELPER CELLS (CD4) COUNT (NOT AT ARMC)
Absolute CD4: 732 cells/uL (ref 490–1740)
CD4 T Helper %: 37 % (ref 30–61)
Total lymphocyte count: 2005 cells/uL (ref 850–3900)

## 2023-01-21 LAB — HIV-1 RNA QUANT-NO REFLEX-BLD
HIV 1 RNA Quant: <20 Copies/mL — ABNORMAL HIGH
HIV-1 RNA Quant, Log: <1.3 Log cps/mL — ABNORMAL HIGH

## 2023-01-22 ENCOUNTER — Other Ambulatory Visit (HOSPITAL_COMMUNITY): Payer: Self-pay

## 2023-01-22 ENCOUNTER — Other Ambulatory Visit: Payer: Self-pay

## 2023-01-22 DIAGNOSIS — B20 Human immunodeficiency virus [HIV] disease: Secondary | ICD-10-CM

## 2023-01-22 MED ORDER — CABOTEGRAVIR & RILPIVIRINE ER 600 & 900 MG/3ML IM SUER
1.0000 | INTRAMUSCULAR | 5 refills | Status: AC
  Filled 2023-01-22: qty 6, 60d supply, fill #0

## 2023-01-22 NOTE — Progress Notes (Signed)
Perfect - Our resident Jerry Finley spoke with him on the phone this morning, and he is scheduled for his first [place] on June 5th with me. Thanks! Marchelle Folks

## 2023-02-12 ENCOUNTER — Other Ambulatory Visit: Payer: Self-pay

## 2023-02-13 ENCOUNTER — Ambulatory Visit: Payer: Medicaid Other | Admitting: Pharmacist

## 2023-02-13 ENCOUNTER — Telehealth: Payer: Self-pay | Admitting: Pharmacist

## 2023-02-13 NOTE — Telephone Encounter (Signed)
Patient again missed his first [place] appointment. Tried calling without success - number is no longer active. Would recommend coming with oral therapy given multiple missed injection appointments with pharmacy.  Margarite Gouge, PharmD, CPP, BCIDP, AAHIVP Clinical Pharmacist Practitioner Infectious Diseases Clinical Pharmacist [facility]

## 2023-02-14 DIAGNOSIS — R442 Other hallucinations: Secondary | ICD-10-CM | POA: Diagnosis not present

## 2023-02-14 DIAGNOSIS — F29 Unspecified psychosis not due to a substance or known physiological condition: Secondary | ICD-10-CM | POA: Diagnosis not present

## 2023-02-14 DIAGNOSIS — F69 Unspecified disorder of adult personality and behavior: Secondary | ICD-10-CM | POA: Diagnosis not present

## 2023-02-14 DIAGNOSIS — R739 Hyperglycemia, unspecified: Secondary | ICD-10-CM | POA: Diagnosis not present

## 2023-02-14 DIAGNOSIS — R456 Violent behavior: Secondary | ICD-10-CM | POA: Diagnosis not present

## 2023-02-15 ENCOUNTER — Emergency Department (HOSPITAL_COMMUNITY)
Admission: EM | Admit: 2023-02-15 | Discharge: 2023-02-15 | Disposition: A | Discharge status: Home / Self Care | Payer: Medicaid Other | Attending: Emergency Medicine | Admitting: Emergency Medicine

## 2023-02-15 ENCOUNTER — Other Ambulatory Visit: Payer: Self-pay

## 2023-02-15 DIAGNOSIS — R7989 Other specified abnormal findings of blood chemistry: Secondary | ICD-10-CM | POA: Insufficient documentation

## 2023-02-15 DIAGNOSIS — R000 Tachycardia, unspecified: Secondary | ICD-10-CM

## 2023-02-15 DIAGNOSIS — F1910 Other psychoactive substance abuse, uncomplicated: Secondary | ICD-10-CM | POA: Diagnosis not present

## 2023-02-15 DIAGNOSIS — R079 Chest pain, unspecified: Secondary | ICD-10-CM | POA: Diagnosis present

## 2023-02-15 LAB — CBC WITH DIFFERENTIAL/PLATELET
Abs Immature Granulocytes: 0.04 10*3/uL (ref 0.00–0.07)
Basophils Absolute: 0 10*3/uL (ref 0.0–0.1)
Basophils Relative: 0 %
Eosinophils Absolute: 0 10*3/uL (ref 0.0–0.5)
Eosinophils Relative: 0 %
HCT: 41.1 % (ref 39.0–52.0)
Hemoglobin: 14.4 g/dL (ref 13.0–17.0)
Immature Granulocytes: 0 %
Lymphocytes Relative: 7 %
Lymphs Abs: 0.9 10*3/uL (ref 0.7–4.0)
MCH: 30 pg (ref 26.0–34.0)
MCHC: 35 g/dL (ref 30.0–36.0)
MCV: 85.6 fL (ref 80.0–100.0)
Monocytes Absolute: 0.9 10*3/uL (ref 0.1–1.0)
Monocytes Relative: 7 %
Neutro Abs: 10.7 10*3/uL — ABNORMAL HIGH (ref 1.7–7.7)
Neutrophils Relative %: 86 %
Platelets: 357 10*3/uL (ref 150–400)
RBC: 4.8 MIL/uL (ref 4.22–5.81)
RDW: 12.7 % (ref 11.5–15.5)
WBC: 12.6 10*3/uL — ABNORMAL HIGH (ref 4.0–10.5)
nRBC: 0 % (ref 0.0–0.2)

## 2023-02-15 LAB — BASIC METABOLIC PANEL
Anion gap: 16 — ABNORMAL HIGH (ref 5–15)
BUN: 17 mg/dL (ref 6–20)
CO2: 21 mmol/L — ABNORMAL LOW (ref 22–32)
Calcium: 8.6 mg/dL — ABNORMAL LOW (ref 8.9–10.3)
Chloride: 102 mmol/L (ref 98–111)
Creatinine, Ser: 1.2 mg/dL (ref 0.61–1.24)
GFR, Estimated: >60 mL/min (ref >60)
Glucose, Bld: 89 mg/dL (ref 70–99)
Potassium: 3.9 mmol/L (ref 3.5–5.1)
Sodium: 139 mmol/L (ref 135–145)

## 2023-02-15 LAB — RAPID URINE DRUG SCREEN, HOSP PERFORMED
Amphetamines: POSITIVE — AB
Barbiturates: NOT DETECTED
Benzodiazepines: POSITIVE — AB
Cocaine: POSITIVE — AB
Opiates: NOT DETECTED
Tetrahydrocannabinol: POSITIVE — AB

## 2023-02-15 LAB — COMPREHENSIVE METABOLIC PANEL
ALT: 19 U/L (ref 0–44)
AST: 47 U/L — ABNORMAL HIGH (ref 15–41)
Albumin: 4.4 g/dL (ref 3.5–5.0)
Alkaline Phosphatase: 78 U/L (ref 38–126)
Anion gap: 12 (ref 5–15)
BUN: 21 mg/dL — ABNORMAL HIGH (ref 6–20)
CO2: 22 mmol/L (ref 22–32)
Calcium: 9.3 mg/dL (ref 8.9–10.3)
Chloride: 103 mmol/L (ref 98–111)
Creatinine, Ser: 1.53 mg/dL — ABNORMAL HIGH (ref 0.61–1.24)
GFR, Estimated: >60 mL/min (ref >60)
Glucose, Bld: 167 mg/dL — ABNORMAL HIGH (ref 70–99)
Potassium: 4.8 mmol/L (ref 3.5–5.1)
Sodium: 137 mmol/L (ref 135–145)
Total Bilirubin: 0.8 mg/dL (ref 0.3–1.2)
Total Protein: 7.5 g/dL (ref 6.5–8.1)

## 2023-02-15 LAB — ACETAMINOPHEN LEVEL: Acetaminophen (Tylenol), Serum: <10 ug/mL — ABNORMAL LOW (ref 10–30)

## 2023-02-15 LAB — TROPONIN I (HIGH SENSITIVITY)
Troponin I (High Sensitivity): 13 ng/L (ref <18)
Troponin I (High Sensitivity): 26 ng/L — ABNORMAL HIGH (ref <18)
Troponin I (High Sensitivity): 34 ng/L — ABNORMAL HIGH (ref <18)
Troponin I (High Sensitivity): 33 ng/L — ABNORMAL HIGH (ref <18)

## 2023-02-15 LAB — SALICYLATE LEVEL: Salicylate Lvl: <7 mg/dL — ABNORMAL LOW (ref 7.0–30.0)

## 2023-02-15 LAB — CBG MONITORING, ED: Glucose-Capillary: 153 mg/dL — ABNORMAL HIGH (ref 70–99)

## 2023-02-15 LAB — ETHANOL: Alcohol, Ethyl (B): <10 mg/dL (ref <10)

## 2023-02-15 MED ORDER — LACTATED RINGERS IV BOLUS
1000.0000 mL | Freq: Once | INTRAVENOUS | Status: AC
  Administered 2023-02-15: 1000 mL via INTRAVENOUS
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
## 2023-02-15 NOTE — Discharge Instructions (Signed)
You had a mildly elevated troponin (heart enzyme), this was thought due to your rapid heart rate and from your drug use.  I advise you to stop using drugs.  Please follow-up with your primary care doctor.  If you would like help with substance abuse, please see the attached resources.
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
## 2023-02-18 ENCOUNTER — Telehealth: Payer: Self-pay

## 2023-02-18 NOTE — Transitions of Care (Post Inpatient/ED Visit) (Signed)
   02/18/2023  Name: Jerry Finley MRN: [id number] DOB: 08/24/1996  Today's TOC FU Call Status: Today's TOC FU Call Status:: Unsuccessul Call (1st Attempt) Unsuccessful Call (1st Attempt) Date: 02/18/23  Attempted to reach the patient regarding the most recent Inpatient/ED visit.  Follow Up Plan: Additional outreach attempts will be made to reach the patient to complete the Transitions of Care (Post Inpatient/ED visit) call.   Abelino Derrick, MHA [facility]  Managed [facility] Social Worker [phone]

## 2023-02-19 ENCOUNTER — Telehealth: Payer: Self-pay

## 2023-02-19 NOTE — Transitions of Care (Post Inpatient/ED Visit) (Signed)
   02/19/2023  Name: Jerry Finley MRN: [id number] DOB: 1996-03-01  Today's TOC FU Call Status: Today's TOC FU Call Status:: Unsuccessful Call (2nd Attempt) Unsuccessful Call (2nd Attempt) Date: 02/19/23  Attempted to reach the patient regarding the most recent Inpatient/ED visit.  Follow Up Plan: Additional outreach attempts will be made to reach the patient to complete the Transitions of Care (Post Inpatient/ED visit) call.   Abelino Derrick, MHA [facility]  Managed [facility] Social Worker [phone]

## 2023-02-22 ENCOUNTER — Ambulatory Visit: Payer: Medicaid Other | Admitting: Physician Assistant

## 2024-07-22 ENCOUNTER — Telehealth: Payer: Self-pay

## 2024-07-22 NOTE — Telephone Encounter (Signed)
 Attempt to Re-Engage in Care  No office visit or HIV labs completed at [facility] within the last 12 months. Patient is considered out of care.   Last [facility] Visit: 01/18/23  Last HIV Viral Load:  HIV 1 RNA Quant  Date Value Ref Range Status  01/18/2023 <20 (H) Copies/mL Final    Comment:    HIV-1 RNA Detected    Last CD4 Count:  CD4 T Cell Abs  Date Value Ref Range Status  01/25/2020 811 400 - 1,790 /uL Final    Medication Dispense History:   Dispensed Days Supply Quantity Provider Pharmacy   BIKTARVY  50/200/25MG  TABLETS 01/18/2023 30 30 each Orlando Burnard SAUNDERS, PA-C [facility]...  BIKTARVY  50/200/25MG  TABLETS 09/17/2022 30 30 each Orlando Burnard SAUNDERS, PA-C [facility] #...    Interventions: [place], no answer. Left HIPAA compliant voicemail requesting callback. No recent labs available via [facility] or [facility]. No recent encounters via [facility].    Duration of Services: 10 minutes  Valor Quaintance, BSN, [employer] [employer]

## 2024-09-25 ENCOUNTER — Telehealth: Payer: Self-pay

## 2024-09-25 NOTE — Telephone Encounter (Signed)
 Called patient to offer appointment, no answer. Left HIPAA compliant voicemail requesting callback.   Letter mailed to address on file.   Abigail Teall, BSN, RN
